# Patient Record
Sex: Female | Born: 1975 | Race: White | Hispanic: No | Marital: Single | Smoking: Former smoker
Health system: Southern US, Community
[De-identification: ages and names within clinical notes are randomized; demographics above are authoritative.]

## PROBLEM LIST (undated history)

## (undated) DIAGNOSIS — N390 Urinary tract infection, site not specified: Secondary | ICD-10-CM

## (undated) DIAGNOSIS — E079 Disorder of thyroid, unspecified: Secondary | ICD-10-CM

## (undated) DIAGNOSIS — F988 Other specified behavioral and emotional disorders with onset usually occurring in childhood and adolescence: Secondary | ICD-10-CM

## (undated) DIAGNOSIS — F319 Bipolar disorder, unspecified: Secondary | ICD-10-CM

## (undated) DIAGNOSIS — F329 Major depressive disorder, single episode, unspecified: Secondary | ICD-10-CM

## (undated) DIAGNOSIS — F32A Depression, unspecified: Secondary | ICD-10-CM

## (undated) DIAGNOSIS — E039 Hypothyroidism, unspecified: Secondary | ICD-10-CM

## (undated) HISTORY — PX: MOUTH SURGERY: SHX715

## (undated) HISTORY — PX: TUBAL LIGATION: SHX77

---

## 1997-03-07 ENCOUNTER — Inpatient Hospital Stay (HOSPITAL_COMMUNITY): Admission: AD | Admit: 1997-03-07 | Discharge: 1997-03-07 | Payer: Self-pay | Admitting: Obstetrics & Gynecology

## 1997-03-12 ENCOUNTER — Ambulatory Visit (HOSPITAL_COMMUNITY): Admission: RE | Admit: 1997-03-12 | Discharge: 1997-03-12 | Payer: Self-pay | Admitting: Obstetrics

## 1997-03-27 ENCOUNTER — Inpatient Hospital Stay (HOSPITAL_COMMUNITY): Admission: AD | Admit: 1997-03-27 | Discharge: 1997-03-27 | Payer: Self-pay | Admitting: Obstetrics

## 1997-04-23 ENCOUNTER — Inpatient Hospital Stay (HOSPITAL_COMMUNITY): Admission: AD | Admit: 1997-04-23 | Discharge: 1997-04-23 | Payer: Self-pay | Admitting: Obstetrics & Gynecology

## 1997-05-02 ENCOUNTER — Inpatient Hospital Stay (HOSPITAL_COMMUNITY): Admission: AD | Admit: 1997-05-02 | Discharge: 1997-05-02 | Payer: Self-pay | Admitting: Obstetrics

## 1997-05-04 ENCOUNTER — Inpatient Hospital Stay (HOSPITAL_COMMUNITY): Admission: AD | Admit: 1997-05-04 | Discharge: 1997-05-04 | Payer: Self-pay | Admitting: *Deleted

## 1997-05-12 ENCOUNTER — Inpatient Hospital Stay (HOSPITAL_COMMUNITY): Admission: AD | Admit: 1997-05-12 | Discharge: 1997-05-18 | Payer: Self-pay | Admitting: *Deleted

## 1997-05-19 ENCOUNTER — Inpatient Hospital Stay (HOSPITAL_COMMUNITY): Admission: AD | Admit: 1997-05-19 | Discharge: 1997-05-26 | Payer: Self-pay | Admitting: Obstetrics

## 1997-06-02 ENCOUNTER — Inpatient Hospital Stay (HOSPITAL_COMMUNITY): Admission: AD | Admit: 1997-06-02 | Discharge: 1997-06-02 | Payer: Self-pay | Admitting: Obstetrics

## 1997-06-03 ENCOUNTER — Encounter: Admission: RE | Admit: 1997-06-03 | Discharge: 1997-09-01 | Payer: Self-pay | Admitting: Obstetrics

## 1997-06-15 ENCOUNTER — Inpatient Hospital Stay (HOSPITAL_COMMUNITY): Admission: AD | Admit: 1997-06-15 | Discharge: 1997-06-16 | Payer: Self-pay | Admitting: *Deleted

## 1997-07-23 ENCOUNTER — Inpatient Hospital Stay (HOSPITAL_COMMUNITY): Admission: AD | Admit: 1997-07-23 | Discharge: 1997-07-25 | Payer: Self-pay | Admitting: Obstetrics & Gynecology

## 1997-08-10 ENCOUNTER — Inpatient Hospital Stay (HOSPITAL_COMMUNITY): Admission: AD | Admit: 1997-08-10 | Discharge: 1997-08-10 | Payer: Self-pay | Admitting: *Deleted

## 1997-08-25 ENCOUNTER — Inpatient Hospital Stay (HOSPITAL_COMMUNITY): Admission: AD | Admit: 1997-08-25 | Discharge: 1997-08-25 | Payer: Self-pay | Admitting: Obstetrics

## 1997-08-31 ENCOUNTER — Observation Stay (HOSPITAL_COMMUNITY): Admission: AD | Admit: 1997-08-31 | Discharge: 1997-09-01 | Payer: Self-pay | Admitting: *Deleted

## 1997-09-02 ENCOUNTER — Encounter: Admission: RE | Admit: 1997-09-02 | Discharge: 1997-12-01 | Payer: Self-pay | Admitting: Obstetrics & Gynecology

## 1997-09-02 ENCOUNTER — Inpatient Hospital Stay (HOSPITAL_COMMUNITY): Admission: AD | Admit: 1997-09-02 | Discharge: 1997-09-02 | Payer: Self-pay | Admitting: Obstetrics & Gynecology

## 1997-09-03 ENCOUNTER — Inpatient Hospital Stay (HOSPITAL_COMMUNITY): Admission: AD | Admit: 1997-09-03 | Discharge: 1997-09-03 | Payer: Self-pay | Admitting: Obstetrics

## 1997-09-05 ENCOUNTER — Inpatient Hospital Stay (HOSPITAL_COMMUNITY): Admission: AD | Admit: 1997-09-05 | Discharge: 1997-09-05 | Payer: Self-pay | Admitting: Obstetrics

## 1997-09-08 ENCOUNTER — Inpatient Hospital Stay (HOSPITAL_COMMUNITY): Admission: AD | Admit: 1997-09-08 | Discharge: 1997-09-11 | Payer: Self-pay | Admitting: Obstetrics

## 1998-01-12 ENCOUNTER — Emergency Department (HOSPITAL_COMMUNITY): Admission: EM | Admit: 1998-01-12 | Discharge: 1998-01-12 | Payer: Self-pay | Admitting: Emergency Medicine

## 1998-01-12 ENCOUNTER — Encounter: Payer: Self-pay | Admitting: Emergency Medicine

## 1998-03-11 ENCOUNTER — Other Ambulatory Visit: Admission: RE | Admit: 1998-03-11 | Discharge: 1998-03-11 | Payer: Self-pay

## 1998-03-11 ENCOUNTER — Other Ambulatory Visit: Admission: RE | Admit: 1998-03-11 | Discharge: 1998-03-11 | Payer: Self-pay | Admitting: Obstetrics

## 1998-04-26 ENCOUNTER — Emergency Department (HOSPITAL_COMMUNITY): Admission: EM | Admit: 1998-04-26 | Discharge: 1998-04-26 | Payer: Self-pay | Admitting: Emergency Medicine

## 1998-07-30 ENCOUNTER — Emergency Department (HOSPITAL_COMMUNITY): Admission: EM | Admit: 1998-07-30 | Discharge: 1998-07-30 | Payer: Self-pay | Admitting: Emergency Medicine

## 2000-02-28 ENCOUNTER — Encounter: Payer: Self-pay | Admitting: Obstetrics

## 2000-02-28 ENCOUNTER — Inpatient Hospital Stay (HOSPITAL_COMMUNITY): Admission: AD | Admit: 2000-02-28 | Discharge: 2000-02-28 | Payer: Self-pay | Admitting: Obstetrics

## 2000-03-28 ENCOUNTER — Inpatient Hospital Stay (HOSPITAL_COMMUNITY): Admission: AD | Admit: 2000-03-28 | Discharge: 2000-03-28 | Payer: Self-pay | Admitting: Obstetrics and Gynecology

## 2000-04-27 ENCOUNTER — Inpatient Hospital Stay (HOSPITAL_COMMUNITY): Admission: AD | Admit: 2000-04-27 | Discharge: 2000-04-27 | Payer: Self-pay | Admitting: Obstetrics & Gynecology

## 2000-05-08 ENCOUNTER — Encounter: Payer: Self-pay | Admitting: Obstetrics & Gynecology

## 2000-05-08 ENCOUNTER — Inpatient Hospital Stay (HOSPITAL_COMMUNITY): Admission: AD | Admit: 2000-05-08 | Discharge: 2000-05-08 | Payer: Self-pay | Admitting: Obstetrics & Gynecology

## 2000-06-28 ENCOUNTER — Inpatient Hospital Stay (HOSPITAL_COMMUNITY): Admission: AD | Admit: 2000-06-28 | Discharge: 2000-06-28 | Payer: Self-pay | Admitting: Obstetrics and Gynecology

## 2000-07-02 ENCOUNTER — Inpatient Hospital Stay (HOSPITAL_COMMUNITY): Admission: AD | Admit: 2000-07-02 | Discharge: 2000-07-02 | Payer: Self-pay | Admitting: Obstetrics & Gynecology

## 2000-07-22 ENCOUNTER — Inpatient Hospital Stay (HOSPITAL_COMMUNITY): Admission: AD | Admit: 2000-07-22 | Discharge: 2000-07-22 | Payer: Self-pay | Admitting: *Deleted

## 2000-07-23 ENCOUNTER — Ambulatory Visit (HOSPITAL_COMMUNITY): Admission: RE | Admit: 2000-07-23 | Discharge: 2000-07-23 | Payer: Self-pay | Admitting: Obstetrics and Gynecology

## 2000-07-23 ENCOUNTER — Encounter: Payer: Self-pay | Admitting: Obstetrics and Gynecology

## 2000-08-22 ENCOUNTER — Inpatient Hospital Stay: Admission: AD | Admit: 2000-08-22 | Discharge: 2000-08-22 | Payer: Self-pay | Admitting: Obstetrics and Gynecology

## 2000-09-09 ENCOUNTER — Inpatient Hospital Stay (HOSPITAL_COMMUNITY): Admission: AD | Admit: 2000-09-09 | Discharge: 2000-09-09 | Payer: Self-pay | Admitting: Obstetrics and Gynecology

## 2000-09-28 ENCOUNTER — Inpatient Hospital Stay (HOSPITAL_COMMUNITY): Admission: AD | Admit: 2000-09-28 | Discharge: 2000-09-28 | Payer: Self-pay | Admitting: Obstetrics and Gynecology

## 2000-09-29 ENCOUNTER — Inpatient Hospital Stay (HOSPITAL_COMMUNITY): Admission: AD | Admit: 2000-09-29 | Discharge: 2000-09-29 | Payer: Self-pay | Admitting: Obstetrics and Gynecology

## 2000-10-02 ENCOUNTER — Ambulatory Visit (HOSPITAL_COMMUNITY): Admission: RE | Admit: 2000-10-02 | Discharge: 2000-10-02 | Payer: Self-pay | Admitting: Obstetrics and Gynecology

## 2000-10-02 ENCOUNTER — Encounter: Payer: Self-pay | Admitting: Obstetrics and Gynecology

## 2000-10-12 ENCOUNTER — Inpatient Hospital Stay (HOSPITAL_COMMUNITY): Admission: AD | Admit: 2000-10-12 | Discharge: 2000-10-16 | Payer: Self-pay | Admitting: Obstetrics and Gynecology

## 2000-11-18 ENCOUNTER — Other Ambulatory Visit: Admission: RE | Admit: 2000-11-18 | Discharge: 2000-11-18 | Payer: Self-pay | Admitting: Obstetrics & Gynecology

## 2001-02-26 ENCOUNTER — Emergency Department (HOSPITAL_COMMUNITY): Admission: EM | Admit: 2001-02-26 | Discharge: 2001-02-26 | Payer: Self-pay | Admitting: Emergency Medicine

## 2001-02-26 ENCOUNTER — Encounter: Payer: Self-pay | Admitting: Emergency Medicine

## 2001-10-30 ENCOUNTER — Encounter: Payer: Self-pay | Admitting: Emergency Medicine

## 2001-10-30 ENCOUNTER — Inpatient Hospital Stay (HOSPITAL_COMMUNITY): Admission: EM | Admit: 2001-10-30 | Discharge: 2001-11-05 | Payer: Self-pay | Admitting: Emergency Medicine

## 2001-10-31 ENCOUNTER — Encounter: Payer: Self-pay | Admitting: Internal Medicine

## 2001-11-24 ENCOUNTER — Ambulatory Visit (HOSPITAL_COMMUNITY): Admission: RE | Admit: 2001-11-24 | Discharge: 2001-11-24 | Payer: Self-pay | Admitting: Family Medicine

## 2001-12-30 ENCOUNTER — Emergency Department (HOSPITAL_COMMUNITY): Admission: EM | Admit: 2001-12-30 | Discharge: 2001-12-30 | Payer: Self-pay

## 2002-01-07 ENCOUNTER — Emergency Department (HOSPITAL_COMMUNITY): Admission: EM | Admit: 2002-01-07 | Discharge: 2002-01-07 | Payer: Self-pay | Admitting: Emergency Medicine

## 2002-03-30 ENCOUNTER — Encounter: Payer: Self-pay | Admitting: Emergency Medicine

## 2002-03-30 ENCOUNTER — Emergency Department (HOSPITAL_COMMUNITY): Admission: EM | Admit: 2002-03-30 | Discharge: 2002-03-30 | Payer: Self-pay | Admitting: Emergency Medicine

## 2002-05-11 ENCOUNTER — Emergency Department (HOSPITAL_COMMUNITY): Admission: EM | Admit: 2002-05-11 | Discharge: 2002-05-11 | Payer: Self-pay | Admitting: Emergency Medicine

## 2002-07-19 ENCOUNTER — Emergency Department (HOSPITAL_COMMUNITY): Admission: EM | Admit: 2002-07-19 | Discharge: 2002-07-19 | Payer: Self-pay | Admitting: Emergency Medicine

## 2002-07-19 ENCOUNTER — Encounter: Payer: Self-pay | Admitting: Emergency Medicine

## 2002-09-02 ENCOUNTER — Emergency Department (HOSPITAL_COMMUNITY): Admission: EM | Admit: 2002-09-02 | Discharge: 2002-09-02 | Payer: Self-pay | Admitting: Emergency Medicine

## 2002-09-02 ENCOUNTER — Encounter: Payer: Self-pay | Admitting: Emergency Medicine

## 2002-09-03 ENCOUNTER — Emergency Department (HOSPITAL_COMMUNITY): Admission: EM | Admit: 2002-09-03 | Discharge: 2002-09-04 | Payer: Self-pay

## 2002-09-05 ENCOUNTER — Emergency Department (HOSPITAL_COMMUNITY): Admission: EM | Admit: 2002-09-05 | Discharge: 2002-09-05 | Payer: Self-pay | Admitting: Emergency Medicine

## 2002-09-07 ENCOUNTER — Emergency Department (HOSPITAL_COMMUNITY): Admission: EM | Admit: 2002-09-07 | Discharge: 2002-09-07 | Payer: Self-pay | Admitting: Emergency Medicine

## 2002-12-17 ENCOUNTER — Inpatient Hospital Stay (HOSPITAL_COMMUNITY): Admission: AD | Admit: 2002-12-17 | Discharge: 2002-12-19 | Payer: Self-pay | Admitting: Obstetrics & Gynecology

## 2002-12-22 ENCOUNTER — Ambulatory Visit (HOSPITAL_COMMUNITY): Admission: RE | Admit: 2002-12-22 | Discharge: 2002-12-22 | Payer: Self-pay | Admitting: Obstetrics and Gynecology

## 2003-02-04 ENCOUNTER — Inpatient Hospital Stay (HOSPITAL_COMMUNITY): Admission: AD | Admit: 2003-02-04 | Discharge: 2003-02-04 | Payer: Self-pay | Admitting: Obstetrics and Gynecology

## 2003-02-16 ENCOUNTER — Inpatient Hospital Stay (HOSPITAL_COMMUNITY): Admission: AD | Admit: 2003-02-16 | Discharge: 2003-02-16 | Payer: Self-pay | Admitting: Obstetrics and Gynecology

## 2003-03-19 ENCOUNTER — Observation Stay (HOSPITAL_COMMUNITY): Admission: AD | Admit: 2003-03-19 | Discharge: 2003-03-20 | Payer: Self-pay | Admitting: Obstetrics and Gynecology

## 2003-03-29 ENCOUNTER — Ambulatory Visit (HOSPITAL_COMMUNITY): Admission: RE | Admit: 2003-03-29 | Discharge: 2003-03-29 | Payer: Self-pay | Admitting: Obstetrics and Gynecology

## 2003-04-16 ENCOUNTER — Inpatient Hospital Stay (HOSPITAL_COMMUNITY): Admission: AD | Admit: 2003-04-16 | Discharge: 2003-04-16 | Payer: Self-pay | Admitting: Obstetrics and Gynecology

## 2003-05-03 ENCOUNTER — Inpatient Hospital Stay (HOSPITAL_COMMUNITY): Admission: AD | Admit: 2003-05-03 | Discharge: 2003-05-03 | Payer: Self-pay | Admitting: Obstetrics and Gynecology

## 2003-05-23 ENCOUNTER — Inpatient Hospital Stay (HOSPITAL_COMMUNITY): Admission: AD | Admit: 2003-05-23 | Discharge: 2003-05-23 | Payer: Self-pay | Admitting: Obstetrics and Gynecology

## 2003-06-03 ENCOUNTER — Inpatient Hospital Stay (HOSPITAL_COMMUNITY): Admission: AD | Admit: 2003-06-03 | Discharge: 2003-06-03 | Payer: Self-pay | Admitting: Obstetrics and Gynecology

## 2003-06-17 ENCOUNTER — Inpatient Hospital Stay (HOSPITAL_COMMUNITY): Admission: AD | Admit: 2003-06-17 | Discharge: 2003-06-17 | Payer: Self-pay | Admitting: Obstetrics and Gynecology

## 2003-06-25 ENCOUNTER — Inpatient Hospital Stay (HOSPITAL_COMMUNITY): Admission: AD | Admit: 2003-06-25 | Discharge: 2003-06-25 | Payer: Self-pay | Admitting: Obstetrics and Gynecology

## 2003-06-26 ENCOUNTER — Emergency Department (HOSPITAL_COMMUNITY): Admission: EM | Admit: 2003-06-26 | Discharge: 2003-06-26 | Payer: Self-pay | Admitting: Emergency Medicine

## 2003-06-26 ENCOUNTER — Inpatient Hospital Stay (HOSPITAL_COMMUNITY): Admission: AD | Admit: 2003-06-26 | Discharge: 2003-06-26 | Payer: Self-pay | Admitting: Obstetrics and Gynecology

## 2003-07-05 ENCOUNTER — Inpatient Hospital Stay (HOSPITAL_COMMUNITY): Admission: AD | Admit: 2003-07-05 | Discharge: 2003-07-06 | Payer: Self-pay | Admitting: Obstetrics

## 2003-07-15 ENCOUNTER — Inpatient Hospital Stay (HOSPITAL_COMMUNITY): Admission: AD | Admit: 2003-07-15 | Discharge: 2003-07-15 | Payer: Self-pay | Admitting: Obstetrics

## 2003-07-22 ENCOUNTER — Inpatient Hospital Stay (HOSPITAL_COMMUNITY): Admission: AD | Admit: 2003-07-22 | Discharge: 2003-07-22 | Payer: Self-pay | Admitting: Obstetrics

## 2003-07-24 ENCOUNTER — Inpatient Hospital Stay (HOSPITAL_COMMUNITY): Admission: AD | Admit: 2003-07-24 | Discharge: 2003-07-24 | Payer: Self-pay | Admitting: Obstetrics

## 2003-07-25 ENCOUNTER — Inpatient Hospital Stay (HOSPITAL_COMMUNITY): Admission: AD | Admit: 2003-07-25 | Discharge: 2003-07-25 | Payer: Self-pay | Admitting: Obstetrics

## 2003-08-05 ENCOUNTER — Inpatient Hospital Stay (HOSPITAL_COMMUNITY): Admission: AD | Admit: 2003-08-05 | Discharge: 2003-08-05 | Payer: Self-pay | Admitting: Obstetrics

## 2003-08-10 ENCOUNTER — Ambulatory Visit (HOSPITAL_COMMUNITY): Admission: RE | Admit: 2003-08-10 | Discharge: 2003-08-10 | Payer: Self-pay | Admitting: Obstetrics

## 2003-08-12 ENCOUNTER — Inpatient Hospital Stay (HOSPITAL_COMMUNITY): Admission: AD | Admit: 2003-08-12 | Discharge: 2003-08-14 | Payer: Self-pay | Admitting: Obstetrics

## 2003-10-20 ENCOUNTER — Inpatient Hospital Stay (HOSPITAL_COMMUNITY): Admission: AD | Admit: 2003-10-20 | Discharge: 2003-10-20 | Payer: Self-pay | Admitting: Obstetrics

## 2003-11-10 ENCOUNTER — Emergency Department (HOSPITAL_COMMUNITY): Admission: EM | Admit: 2003-11-10 | Discharge: 2003-11-10 | Payer: Self-pay | Admitting: Emergency Medicine

## 2004-02-14 ENCOUNTER — Emergency Department (HOSPITAL_COMMUNITY): Admission: EM | Admit: 2004-02-14 | Discharge: 2004-02-14 | Payer: Self-pay | Admitting: Emergency Medicine

## 2004-06-15 ENCOUNTER — Emergency Department (HOSPITAL_COMMUNITY): Admission: EM | Admit: 2004-06-15 | Discharge: 2004-06-15 | Payer: Self-pay | Admitting: Emergency Medicine

## 2006-04-10 ENCOUNTER — Inpatient Hospital Stay (HOSPITAL_COMMUNITY): Admission: AD | Admit: 2006-04-10 | Discharge: 2006-04-10 | Payer: Self-pay | Admitting: Gynecology

## 2006-04-10 ENCOUNTER — Ambulatory Visit: Payer: Self-pay | Admitting: *Deleted

## 2010-02-25 ENCOUNTER — Encounter: Payer: Self-pay | Admitting: Obstetrics and Gynecology

## 2010-06-23 NOTE — Consult Note (Signed)
[name], [name] NO.:  [id number]   MEDICAL RECORD NO.:  [id number]                   PATIENT TYPE:  INP   LOCATION:  0369                                 FACILITY:  [facility]   PHYSICIAN:  Genene Churn. Love, MD                   DATE OF BIRTH:  Jun 11, 1975   DATE OF CONSULTATION:  10/31/2001  DATE OF DISCHARGE:                                   CONSULTATION   The patient's address is [address] [place], [place],  [place]  [address].   INTRODUCTION:  This 35 year old right-handed white married female was seen  in consultation through Dr. Tresa Endo for evaluation of presyncope.   HISTORY OF PRESENT ILLNESS:  The patient has had a history of depression  since January 2003.  Her husband has been in prison, and she had a child in  the fall of 2002.  In January she was placed on Zoloft for symptoms of  depression and several days ago was placed on Wellbutrin for ADD by Dr.  Valentina Lucks.  Last week while at school she had some headache and chest pain  with lightheaded sensation and was seen by EMTs there and found to have a  blood pressure of 86/52.  She has been having some symptoms of nausea and  diarrhea and yesterday was driving her car.  She felt nauseated, had  diarrhea, and came to the emergency room.  There she was noted to have a  drop in blood pressure into the 70/40 range.  She was having some episodes  of not knowing exactly what she was doing and possibly confusion which she  related to hypotension as well as some headache related to the hypotension.  She has no personal history of seizures but does have a father who has had a  history of seizures.  Her current medications are Wellbutrin 150 mg q.d.  started three days prior to her admission and Zoloft 100 mg per day started  in January 2003.  She does not abuse alcohol.  She does smoke a pack per day  of cigarettes.  She has had no other history of serious medical conditions.  She has no other family history of serious medical conditions.  She has  three children, ages 58, 56, and 1.   PHYSICAL EXAMINATION:  GENERAL:  Well-developed, obese white female in no  acute distress.  VITAL SIGNS:  Blood pressure in the right and left arm of 90/60, standing it  was 100/60, heart rate was 75.  Telemetry showed normal sinus rhythm.  There  were no bruits.  NEUROLOGIC:  She was alert and oriented x 3 and followed three-step  commands.  Cranial nerve examination revealed her visual fields to be full.  Pupils reactive from 5 to 3 bilaterally.  Corneals were present.  There was  no seventh nerve palsy.  Hearing was intact.  Air conduction was greater  than bone conduction.  The tongue was midline.  The uvula was midline.  Gag  was present.  Sternocleidomastoid and trapezius testing were normal.  Motor  examination revealed bilateral body strength proximally and distally in the  upper and lower extremities.  Coordination testing was normal.  Finger-to-  nose, heel-to-shin, rapid alternating movements were normal.  Sensory  examination was intact to pinprick, touch, joint position.  Deep tendon  reflexes were 2+.  Plantar responses were downgoing.   LABORATORY DATA:  Initial white blood cell count was 9100 with repeat of  5100, hemoglobins were 12.5 and 12.2, with hematocrit 35.4.  Sodium 140,  today 3.8.  Potassium chloride 113, CO2 of 24, BUN 7, creatinine 0.8,  glucose 101.  Urine pregnancy test was negative.  Serum cortisol was 11.5.   a CT scan of the brain without contrast enhancement, which I have reviewed,  was unremarkable.   IMPRESSION:  1. Presyncope, code 780.2, possibly related to vasovagal reactions and     hypotension.  2. History of hypotension, code 458.0.  3. Gastrointestinal upset, etiology unknown.  4. Depression, code 311.   PLAN:  The plan at this time is to do a 24-hour urine for porphobilinogens  and EEG.                                                Genene Churn. Sandria Manly, MD    JML/MEDQ  D:  10/31/2001  T:  10/31/2001  Job:  [id number]

## 2010-06-23 NOTE — Op Note (Signed)
[name], [name] NO.:  [id number]   MEDICAL RECORD NO.:  [id number]                   PATIENT TYPE:  OIB   LOCATION:  2856                                 FACILITY:  [facility]   PHYSICIAN:  Armanda Magic, M.D.                  DATE OF BIRTH:  09-15-1975   DATE OF PROCEDURE:  11/24/2001  DATE OF DISCHARGE:  11/24/2001                                 OPERATIVE REPORT   CLINICAL NOTE:  This is a 35 year old white female with a recent  hospitalization for presyncope and nausea and dizzy spells.  Also reportedly  with chest pain, although it is not stated in any of her H&P's or consults.  She states that she has been having problems with chest pain described as  stabbing pain that occurs with skipped heartbeats, usually two to three  times a day for 15 minutes, and this has been going on for some time.  She  now presents for tilt table testing.   DESCRIPTION OF PROCEDURE:  The patient was brought to the cardiac  catheterization laboratory in the fasting, nonsedated state.  Informed  consent was obtained.  The patient was connected to continuous heart rate  and pulse oximetry monitoring, intermittent blood pressure monitoring.  The  patient's blood pressure was measured supine for five minutes and ranged  anywhere from 106/57 to 123/71 with heart rates in the 50s-60s.  Immediately  upon upright tilt to 70 degrees, the patient complained of dizziness.  Blood  pressure was stable at 114/66 with a heart rate of 75.  Throughout the  upright tilt, the patient complained of nausea, being sleepy and  lightheaded, with really no change in her blood pressure.  She also  developed a headache.  Twenty minutes into the upright tilt she started  complaining of left-sided chest pain which is typical chest pain she gets.  EKG showed no ischemia.  It was decided because of the intermittent chest  pain that we would not use Isuprel and continued a 30-minute upright tilt.  Her blood pressure pretty much remained stable throughout the tilt, except  at one point it went to 77/49 mmHg with no change in her symptoms, heart  rate was 96 beats per minute.  This was at 41 minutes into the upright tilt.  At the end of the tilt the patient was placed supine.  She was chest pain-  free at that time.   ASSESSMENT:  Negative tilt table test for syncope.  She did have one episode  of transient orthostatic hypotension, but this did not create any new  symptoms of dizziness or nausea.   PLAN:  We will discharge her to home.  She will follow up with Dr. Fraser Din  as an outpatient.  Armanda Magic, M.D.    TT/MEDQ  D:  11/25/2001  T:  11/25/2001  Job:  [id number]

## 2010-06-23 NOTE — Op Note (Signed)
   [name], [name] NO.:  [id number]   MEDICAL RECORD NO.:  [id number]                   PATIENT TYPE:  INP   LOCATION:  0369                                 FACILITY:  [facility]   PHYSICIAN:  Genene Churn. Love, MD                   DATE OF BIRTH:  1975-02-18   DATE OF PROCEDURE:  11/01/2001  DATE OF DISCHARGE:                                 OPERATIVE REPORT   PROCEDURE:  EEG.   EEG NO.:  [id number]   CLINICAL INFORMATION:  This 35 year old female has had a history of  presyncope and hypotension, with altered mental status.  A CT scan has been  unremarkable and blood studies today have been negative.   TECHNICAL DESCRIPTION:  This EEG was recorded during the awake state.  The  background activity shows 12 Hz rhythms of higher amplitude seen in the  posterior head regions bilaterally.  No focal asymmetries, phase II sleep or  epileptic form activities are present.  Hyperventilation testing and photic  stimulation were not performed.   IMPRESSION:  Normal EEG during the awake state.                                               Genene Churn. Sandria Manly, MD    JML/MEDQ  D:  11/01/2001  T:  11/01/2001  Job:  [id number]

## 2010-06-23 NOTE — Consult Note (Signed)
NAME:  [name], [name]                          ACCOUNT NO.:  [id number]   MEDICAL RECORD NO.:  [id number]                   PATIENT TYPE:  MAT   LOCATION:  MATC                                 FACILITY:  [facility]   PHYSICIAN:  Charles A. Sydnee Cabal, MD            DATE OF BIRTH:  1975-02-23   DATE OF CONSULTATION:  06/25/2003  DATE OF DISCHARGE:                                   CONSULTATION   EMERGENCY ROOM CONSULTATION   REASON FOR CONSULTATION:  A 35 year old para 4-0-2-4 at 31-and-a-half weeks  gestation by 5-week ultrasound giving [facility] August 22, 2003 who was noted in the  office today to have tight fingertip dilation.  She complained of  contractions.  She has refused to take terbutaline as directed 5 mg q.4h.  She did this for several days and then stopped it 2 days ago, stating it did  not help the pain.  She has been noncompliant in multiple recommendations  for terbutaline, bedrest, help with the children, notify if any  contractions, and comes in now with this complaint.  She states she has pain  in the abdomen and wants pain medication.  She had some take-home Vicodin  from a previous ER evaluation.  She is not sleeping well.  She notes active  fetal movement.  Denies ruptured membranes or bleeding.   PAST MEDICAL HISTORY:  None.   SURGICAL HISTORY:  SVD x4, elective abortion x1, spontaneous abortion x1.   MEDICATIONS:  Vicodin p.r.n., NataChew.   ALLERGIES:  No known drug allergies.   SOCIAL HISTORY:  Currently not with father of the baby.  No alcohol or drug  use.   FAMILY HISTORY:  Noncontributory.   PHYSICAL EXAMINATION:  VITAL SIGNS:  See chart for update.  Otherwise,  admission 107/60 blood pressure, pulse 111, respirations 20, temperature  98.5.  HEENT:  Grossly normal.  NECK:  Supple.  CORONARY:  Regular rate and rhythm.  LUNGS:  Clear.  ABDOMEN:  Soft, fundal height consistent with 31-32 weeks.  PELVIC:  Exam per my clinic today:  Tight fingertip, posterior,  and high,  ballotable.  Mildly softened but not very much and only very little effaced.  EXTREMITIES:  Mild swelling of lower extremities.   Fetal heart rate is 140s, reactive, without accelerations.  Occasional  contractions every 5-7 minutes are noted.   ASSESSMENT:  1. Intrauterine pregnancy at 31-and-a-half weeks.  2. Preterm contractions.  3. Laboratory data:  CMP and CBC were normal.  Very mild anemia with     hematocrit 33.5.  Urinalysis was normal.  4. Symptoms consistent with gastroesophageal reflux disease, currently mild.   PLAN:  1. Subcu terbutaline 0.25 x1; terbutaline 5 mg q.4h. - prescription given.  2. Vicodin occasionally at night, at most two times a day - prescription is     given.  3. Ambien 10 mg h.s. p.r.n., use sparingly.  4. Bedrest.  5. Help with  family.  6. Preterm labor precautions given.  7. Follow up in the office next Wednesday; otherwise, return for     reevaluation.   She was in agreement.  We discussed steroids and secondary to threatening  precautions and some risk at this point, steroids were discussed.  She gives  informed consent in this regard.  Betamethasone 12.5 now and repeat in 24  hours.  Risks and benefits were all discussed.                                               Charles A. Sydnee Cabal, MD    CAD/MEDQ  D:  06/25/2003  T:  06/26/2003  Job:  [id number]

## 2010-06-23 NOTE — Discharge Summary (Signed)
NAME:  [name], [name]                          ACCOUNT NO.:  [id number]   MEDICAL RECORD NO.:  [id number]                   PATIENT TYPE:  OBV   LOCATION:  9320                                 FACILITY:  [facility]   PHYSICIAN:  Rudy Jew. Ashley Royalty, M.D.             DATE OF BIRTH:  24-Jun-1975   DATE OF ADMISSION:  03/19/2003  DATE OF DISCHARGE:  03/20/2003                                 DISCHARGE SUMMARY   DISCHARGE DIAGNOSES:  1. Intrauterine pregnancy at approximately [redacted] weeks gestation.  2. Preterm irritability, resolved.  3. Bacterial vaginosis.   PROCEDURES:  None.   DISCHARGE MEDICATIONS:  Flagyl 500 mg p.o. b.i.d. x7 days.   HISTORY OF PRESENT ILLNESS:  This is a 35 year old gravida 7, para 4, at  approximately  [redacted] weeks gestation. No prenatal records were available. She  presented to [facility] complaining of contractions and back  discomfort. She was subsequently forwarded to [facility] for further  evaluation and therapy.   Upon arrival she denied any leakage of fluids or bleeding. Examination of  the toco monitor  revealed uterine irritability. Fetal heart rate was  excellent. Pelvic examination  was essentially negative. Numerous cultures  were obtained including  GC and Chlamydia and group B Strep. Wet prep  revealed bacterial vaginosis. Urinalysis  was negative.   HOSPITAL COURSE:  The patient was objectively speaking stable for discharge  from the MAU area. However, she was quite adamant about obtaining an  obstetrical using because in another pregnancy she turned out to have an  adnexal mass which caused her to have quite a bit of pain prior to its  diagnosis. She also was writhing in the bed somewhat around the time of  proposed discharge  and the decision was made to hold her for 23 hour  observation.   Over the course of the same calendar day, she was noted to be quite  comfortable in the bed requiring no medications. An ultrasound was performed  which was consistent with approximately [redacted] weeks gestation. There was no  evidence of any abnormality or adnexal mass. On the afternoon of March 20, 2003, the patient was felt to be stable for discharge and was discharged  to home in satisfactory condition.   ACCESSORY CLINICAL FINDINGS:  Urinalysis was negative. The remainder of the  cultures are pending  at the time of this dictation.                                               James A. Ashley Royalty, M.D.    JAM/MEDQ  D:  03/20/2003  T:  03/20/2003  Job:  [id number]   cc:   Leonette Most A. Sydnee Cabal, MD  Fax: [phone]

## 2010-06-23 NOTE — Discharge Summary (Signed)
NAMELILIENNE, WEINS NO.:  [id number]   MEDICAL RECORD NO.:  [id number]                   PATIENT TYPE:  INP   LOCATION:  1914                                 FACILITY:  [facility]   PHYSICIAN:  Lazaro Arms, M.D.        DATE OF BIRTH:  [birth year]   DATE OF ADMISSION:  10/30/2001  DATE OF DISCHARGE:                                 DISCHARGE SUMMARY   HISTORY OF PRESENT ILLNESS:  The patient is a 35 year old female who  presented to the emergency room on September 25 with a presyncopal episode.  Her review of systems was significant for a history of depression in January  2003 for which she had been on Zoloft and a more recent history of having  recently started Wellbutrin several days prior to presentation.  In the week  prior to presentation she had had some lightheaded sensations associated  with a blood pressure of 86/52 and had had some gastroenteritis symptoms on  the day prior to admission.  In the emergency room she had a blood pressure  of 70/40.  She does associate these headaches and lightheaded feelings  specifically when she changes position.  Almost all the episodes are on  going from sitting to standing or lying to standing.  She was admitted to  the hospital and neurologic consultation by Dr. Sandria Manly was obtained for work-  up of presyncope.   HOSPITAL COURSE:  Neurologic investigation by Dr. Sandria Manly was essentially  within normal limits.  She had an EEG which was normal.  She had a CT of the  head which was normal.  Her neurologic examination remained within normal  limits.  In addition, her blood sugars were investigated and they were also  within normal limits.  The patient was able to ambulate independently, and  in fact, was making regular visits outside to smoke cigarettes and had no  problem with that.  She did, however, continue to have the dizziness and  lightheadedness whenever standing up, especially suddenly, although  this  improved with IV hydration during the hospitalization.  She also had an  echocardiogram done to look at cardiac causes of presyncope and it was a  technically difficult study, although essentially within normal limits.  Her  ejection fraction was at the lower limits of normal and there were no  valvular abnormalities found.  Further investigation of her symptoms was  cortisol level which was within normal limits as well as urine for perforins  and metanephrine's.  The urine for perforins was negative.  The urine for  metanephrine's was is still pending.  On the day of discharge the patient  was asymptomatic.  She was afebrile.  Pulse was 72.  Blood pressure was  90/49.  Blood sugar was 88 fasting.  She was able to stand up without any  dizziness.  She had a normal cardiac and pulmonary examination.  Her balance  was okay  and her gait was within normal limits.  The patient was discharged  in stable condition.   DISCHARGE DIAGNOSES:  1. Presyncopal episode likely related to volume depletion and possibly     Wellbutrin medication side effect.  2. Depression.   DISCHARGE PLAN:  1. She is to follow-up with Dr. Valentina Lucks next week.  2. She is to discontinue her Wellbutrin.  3. She is to drink at least 64 ounces of fluid a day and make sure she has     adequate salt in her diet.    DISCHARGE MEDICATIONS:  1. Zoloft 100 mg p.o. q.d.  2. Protonix 40 mg p.o. q.d.   The patient was also advised to stop smoking.                                               Lazaro Arms, M.D.    AMC/MEDQ  D:  11/05/2001  T:  11/05/2001  Job:  [id number]   cc:   Gretta Arab. Valentina Lucks, M.D.

## 2010-06-23 NOTE — Consult Note (Signed)
NAME:  [name]                        ACCOUNT NO.:  [id number]   MEDICAL RECORD NO.:  [id number]                   PATIENT TYPE:  INP   LOCATION:  9373                                 FACILITY:  [facility]   PHYSICIAN:  Sandria Bales. Ezzard Standing, M.D.               DATE OF BIRTH:  24-Dec-1975   DATE OF CONSULTATION:  DATE OF DISCHARGE:                                   CONSULTATION   REASON FOR CONSULTATION:  Abdominal pain, rule out appendicitis.   HISTORY OF PRESENT ILLNESS:  This is a 35 year old white female who sees Dr.  Maurice Small as her primary medical physician. She presented this evening  to the [facility] emergency department with 24-hour history of  cramping abdominal pain. She found out she was pregnant earlier this week,  actually seeing Dr. Maurice Small this week, was set up to see [facility]  OB/GYN, but she is not sure of the physician she was supposed to see and  anyway presented tonight into the [facility] emergency department. She  describes her pain as beginning yesterday. It was crampy in nature with some  mild nausea. It then got worse today with knife-like pain, and she presented  with what sounds like about 1 or 2:00 today. She has had no vomiting. She  had a normal bowel movement right before she came here. This afternoon, she  was able to eat some eggs and [place] fries, and she is actually very hungry  at the time of my interview with her.   She denies a history of peptic ulcer disease, liver disease, pancreatic  disease, change in bowel habits. She has had no prior history of laparoscopy  or abdominal operation.   From a GI standpoint, she has had four live pregnancies with her children  being 9, 8, 5, and 2. She has had one abortion and one miscarriage, and  again, she thinks she is about eight weeks into this currently pregnancy.   ALLERGIES:  She has no allergies.   REVIEW OF SYSTEMS:  PULMONARY:  She smokes cigarettes and knows this is bad  for her  health and the health of her children. CARDIAC:  She has no history  of heart disease or chest pain. GASTROINTESTINAL:  See history of present  illness. UROLOGIC:  No history of kidney stones or kidney infections.  GYNECOLOGICAL:  Besides her pregnancy, she did have an abnormal Pap, I think  1998, with a colposcopy around that time.   SOCIAL HISTORY:  She works at a [employer].   PHYSICAL EXAMINATION:  VITAL SIGNS:  Her temperature is 99.2, blood pressure  111/57, pulse 87.  GENERAL:  She is a well-nourished, white female, alert and cooperative. On  physical exam, she was lying on her abdomen asleep when I entered the room.  She rolls over really without any pain.  HEENT:  Her HEENT is unremarkable.  NECK:  Her neck  is supple. I feel no masses, no thyromegaly.  LUNGS:  Clear to auscultation.  HEART:  Her heart has a regular rate and rhythm. I hear no murmur or rub.  ABDOMEN:  She has mild tenderness in her right abdomen and suprapubic area,  and actually, the patient is almost around to her flank. She has no  guarding, no rebound. Her bowel sounds are decreased but relatively normal.  She has no palpable mass. No evidence of hernia.  EXTREMITIES:  She has good strength in the upper and lower extremities.  NEUROLOGICAL:  Grossly intact.   LABORATORY DATA:  Labs that I have show a white blood cell count of 7,400  with a differential of 69% neutrophils, 26% lymphocytes, 5% monocytes. She  had a HCG that was quantitative of 1,609 which actually correlates with  probably about a 3 to 4 week pregnancy. She has a negative urinalysis.   IMPRESSION:  Impression is that of probably early pregnancy. I doubt her  abdominal pain is appendicitis. I think it is reasonable to go ahead and  start her on some liquids at this time. She is going to be admitted by Dr.  Ashley Royalty for further observation and repeating of her HCG. I would probably  repeat her CBC in the morning to make  sure that is no different, and I would  be happy to follow her.                                               Sandria Bales. Ezzard Standing, M.D.    DHN/MEDQ  D:  12/17/2002  T:  12/18/2002  Job:  [id number]   cc:   Gretta Arab. Valentina Lucks, M.D.  [address] [place]  [place] [address]  Fax: [phone]   Rudy Jew. Ashley Royalty, M.D.  [address]  [place], [place] [address]  Fax: [phone]

## 2011-06-27 ENCOUNTER — Encounter (HOSPITAL_COMMUNITY): Payer: Self-pay | Admitting: *Deleted

## 2011-06-27 ENCOUNTER — Emergency Department (HOSPITAL_COMMUNITY): Payer: Self-pay

## 2011-06-27 ENCOUNTER — Emergency Department (HOSPITAL_COMMUNITY)
Admission: EM | Admit: 2011-06-27 | Discharge: 2011-06-27 | Disposition: A | Discharge status: Home / Self Care | Payer: Self-pay | Attending: Emergency Medicine | Admitting: Emergency Medicine

## 2011-06-27 DIAGNOSIS — R5383 Other fatigue: Secondary | ICD-10-CM

## 2011-06-27 DIAGNOSIS — N390 Urinary tract infection, site not specified: Secondary | ICD-10-CM | POA: Insufficient documentation

## 2011-06-27 DIAGNOSIS — G43909 Migraine, unspecified, not intractable, without status migrainosus: Secondary | ICD-10-CM | POA: Insufficient documentation

## 2011-06-27 HISTORY — DX: Disorder of thyroid, unspecified: E07.9

## 2011-06-27 HISTORY — DX: Other specified behavioral and emotional disorders with onset usually occurring in childhood and adolescence: F98.8

## 2011-06-27 LAB — URINALYSIS, ROUTINE W REFLEX MICROSCOPIC
Bilirubin Urine: NEGATIVE
Glucose, UA: NEGATIVE mg/dL
Ketones, ur: NEGATIVE mg/dL
Leukocytes, UA: NEGATIVE
Nitrite: POSITIVE — AB
Protein, ur: NEGATIVE mg/dL
Specific Gravity, Urine: 1.02 (ref 1.005–1.030)
Urobilinogen, UA: 0.2 mg/dL (ref 0.0–1.0)
pH: 6 (ref 5.0–8.0)

## 2011-06-27 LAB — DIFFERENTIAL
Basophils Absolute: 0 10*3/uL (ref 0.0–0.1)
Basophils Relative: 0 % (ref 0–1)
Eosinophils Absolute: 0.2 10*3/uL (ref 0.0–0.7)
Neutro Abs: 4.3 10*3/uL (ref 1.7–7.7)
Neutrophils Relative %: 57 % (ref 43–77)

## 2011-06-27 LAB — POCT I-STAT, CHEM 8
Glucose, Bld: 96 mg/dL (ref 70–99)
Potassium: 3.7 mEq/L (ref 3.5–5.1)
Sodium: 143 mEq/L (ref 135–145)
TCO2: 21 mmol/L (ref 0–100)

## 2011-06-27 LAB — CBC
MCH: 30.9 pg (ref 26.0–34.0)
MCHC: 34.3 g/dL (ref 30.0–36.0)
RDW: 13.3 % (ref 11.5–15.5)

## 2011-06-27 LAB — URINE MICROSCOPIC-ADD ON

## 2011-06-27 MED ORDER — NITROFURANTOIN MACROCRYSTAL 100 MG PO CAPS
100.0000 mg | ORAL_CAPSULE | Freq: Once | ORAL | Status: AC
  Administered 2011-06-27: 100 mg via ORAL
  Filled 2011-06-27: qty 1

## 2011-06-27 MED ORDER — SODIUM CHLORIDE 0.9 % IV BOLUS (SEPSIS)
1000.0000 mL | Freq: Once | INTRAVENOUS | Status: AC
  Administered 2011-06-27: 1000 mL via INTRAVENOUS

## 2011-06-27 MED ORDER — KETOROLAC TROMETHAMINE 30 MG/ML IJ SOLN
30.0000 mg | Freq: Once | INTRAMUSCULAR | Status: AC
  Administered 2011-06-27: 30 mg via INTRAVENOUS
  Filled 2011-06-27: qty 1

## 2011-06-27 MED ORDER — METOCLOPRAMIDE HCL 5 MG/ML IJ SOLN
10.0000 mg | Freq: Once | INTRAMUSCULAR | Status: AC
  Administered 2011-06-27: 10 mg via INTRAVENOUS
  Filled 2011-06-27: qty 2

## 2011-06-27 MED ORDER — NITROFURANTOIN MACROCRYSTAL 100 MG PO CAPS: 100.0000 mg | ORAL_CAPSULE | Freq: Once | ORAL | Status: AC

## 2011-06-27 MED ORDER — DEXTROSE 5 % IV SOLN: 1.0000 g | Freq: Once | INTRAVENOUS | Status: DC

## 2011-06-27 NOTE — Discharge Instructions (Signed)
Unfortunately had to leave for her for a family emergency before your evaluation.  Could be completed at this point, you do have a urinary tract, infection, and prescribed antibiotics for such You have  been referred to the [facility] to become established for regular medical care

## 2011-06-27 NOTE — ED Provider Notes (Signed)
Medical screening examination/treatment/procedure(s) were performed by non-physician practitioner and as supervising physician I was immediately available for consultation/collaboration.   Carlosdaniel Grob, MD 06/27/11 2322 

## 2011-06-27 NOTE — ED Provider Notes (Signed)
History     CSN: [id number]  Arrival date & time 06/27/11  1914   None     Chief Complaint  Patient presents with  . Migraine    (Consider location/radiation/quality/duration/timing/severity/associated sxs/prior treatment) Patient is a 36 y.o. female presenting with migraine. The history is provided by the patient.  Migraine This is a chronic problem. The current episode started 1 to 4 weeks ago. The problem occurs constantly. The problem has been gradually worsening. Associated symptoms include arthralgias. Pertinent negatives include no abdominal pain, anorexia, change in bowel habit, chest pain, chills, congestion, coughing, neck pain or numbness.    Past Medical History  Diagnosis Date  . Thyroid disease   . ADD (attention deficit disorder)     Past Surgical History  Procedure Date  . Mouth surgery   . Tubal ligation     Family History  Problem Relation Age of Onset  . Cancer Other   . Heart failure Other     History  Substance Use Topics  . Smoking status: Former Smoker    Quit date: 05/16/2011  . Smokeless tobacco: Not on file  . Alcohol Use:      socially    OB History    Grav Para Term Preterm Abortions TAB SAB Ect Mult Living                  Review of Systems  Constitutional: Positive for unexpected weight change. Negative for chills.  HENT: Negative for congestion and neck pain.   Respiratory: Negative for cough.   Cardiovascular: Negative for chest pain.  Gastrointestinal: Negative for abdominal pain, anorexia and change in bowel habit.  Musculoskeletal: Positive for arthralgias.  Neurological: Negative for numbness.    Allergies  Phenergan  Home Medications   Current Outpatient Rx  Name Route Sig Dispense Refill  . NITROFURANTOIN MACROCRYSTAL 100 MG PO CAPS Oral Take 1 capsule (100 mg total) by mouth once. 7 capsule 0    BP 113/68  Pulse 89  Temp(Src) 97.9 F (36.6 C) (Oral)  Resp 16  SpO2 98%  LMP 04/07/2011  Physical Exam   Constitutional: She is oriented to person, place, and time. She appears well-developed and well-nourished.       Obese patient, states she's gained weight from 162 to205 in the last 3, weeks  HENT:  Head: Normocephalic.  Eyes: Pupils are equal, round, and reactive to light.  Neck: Normal range of motion.  Cardiovascular: Normal rate.   Pulmonary/Chest: Effort normal.  Abdominal: Soft. Bowel sounds are normal. She exhibits no distension. There is no tenderness.  Musculoskeletal: Normal range of motion. She exhibits no edema and no tenderness.  Neurological: She is alert and oriented to person, place, and time.  Skin: Skin is warm. Rash noted. There is pallor.       Chest superficial lesions on her upper extremities, as well as across the front of her chest she, says they've been there for several weeks.  They do not appear infectious    ED Course  Procedures (including critical care time)  Labs Reviewed  URINALYSIS, ROUTINE W REFLEX MICROSCOPIC - Abnormal; Notable for the following:    APPearance HAZY (*)    Hgb urine dipstick TRACE (*)    Nitrite POSITIVE (*)    All other components within normal limits  POCT I-STAT, CHEM 8 - Abnormal; Notable for the following:    BUN 5 (*)    All other components within normal limits  URINE MICROSCOPIC-ADD ON -  Abnormal; Notable for the following:    Squamous Epithelial / LPF FEW (*)    Bacteria, UA MANY (*)    All other components within normal limits  POCT PREGNANCY, URINE  CBC  DIFFERENTIAL   Dg Chest 2 View  06/27/2011  *RADIOLOGY REPORT*  Clinical Data: 6-week history of intermittent chest pain and shortness of breath.  CHEST - 2 VIEW  Comparison: None.  Findings: Cardiomediastinal silhouette unremarkable.   Lungs clear. Bronchovascular markings normal.  Pulmonary vascularity normal.  No pneumothorax.  No pleural effusions.  Mild degenerative changes involving the thoracic spine.  IMPRESSION: No acute or significant abnormality.  Original  Report Authenticated By: Arnell Sieving, M.D.     1. UTI (lower urinary tract infection)   2. Fatigue       MDM  Exam is very nonspecific.  I told her I did not think we would, with a specific diagnosis, but we will rule out many things with blood work, and a chest x-ray.  She is agreeable to that.  Unfortunately, before her evaluation could be completed.  She phone call from a family member, and she needs to leave.  She does have a urinary tract infection.  I discussed this with her and she has been prescribed Macrodantin.  For that and she will follow up with Jovita Kussmaul, [facility] for further evaluation        Arman Filter, NP 06/27/11 2138  Arman Filter, NP 06/27/11 2138

## 2011-06-27 NOTE — ED Notes (Addendum)
C/o tired for 1 month, also has gained wait over last month, tired of being tired, sleeping constantly for last week, developed HA today around 1245, some nausea, (denies: fever, vd). Alert, interactive, calm, NAD. "Not taking ADD meds or thyroid meds". Quit smoking 6 weeks ago & LMP was 3/2.

## 2011-06-27 NOTE — ED Notes (Signed)
Pt alert and oriented, with steady gait at time of discharge. Pt given discharge papers and papers explained. All questions answered and pt walked to discharge.  

## 2011-09-23 ENCOUNTER — Emergency Department (HOSPITAL_COMMUNITY)
Admission: EM | Admit: 2011-09-23 | Discharge: 2011-09-24 | Disposition: A | Discharge status: Home / Self Care | Payer: Medicaid Other | Attending: Emergency Medicine | Admitting: Emergency Medicine

## 2011-09-23 ENCOUNTER — Encounter (HOSPITAL_COMMUNITY): Payer: Self-pay | Admitting: *Deleted

## 2011-09-23 DIAGNOSIS — B9689 Other specified bacterial agents as the cause of diseases classified elsewhere: Secondary | ICD-10-CM | POA: Insufficient documentation

## 2011-09-23 DIAGNOSIS — N760 Acute vaginitis: Secondary | ICD-10-CM | POA: Insufficient documentation

## 2011-09-23 DIAGNOSIS — F988 Other specified behavioral and emotional disorders with onset usually occurring in childhood and adolescence: Secondary | ICD-10-CM | POA: Insufficient documentation

## 2011-09-23 DIAGNOSIS — E079 Disorder of thyroid, unspecified: Secondary | ICD-10-CM | POA: Insufficient documentation

## 2011-09-23 DIAGNOSIS — A499 Bacterial infection, unspecified: Secondary | ICD-10-CM | POA: Insufficient documentation

## 2011-09-23 DIAGNOSIS — R109 Unspecified abdominal pain: Secondary | ICD-10-CM

## 2011-09-23 LAB — URINALYSIS, ROUTINE W REFLEX MICROSCOPIC
Ketones, ur: NEGATIVE mg/dL
Nitrite: NEGATIVE
Protein, ur: NEGATIVE mg/dL
pH: 5.5 (ref 5.0–8.0)

## 2011-09-23 LAB — URINE MICROSCOPIC-ADD ON

## 2011-09-23 NOTE — ED Notes (Signed)
Pt c/o lower abd pain; started a few wks ago on and off.  No urinary problems.  No discharge; nausea no vomiting

## 2011-09-24 ENCOUNTER — Emergency Department (HOSPITAL_COMMUNITY): Payer: Medicaid Other

## 2011-09-24 LAB — WET PREP, GENITAL: Trich, Wet Prep: NONE SEEN

## 2011-09-24 LAB — CBC WITH DIFFERENTIAL/PLATELET
Basophils Relative: 0 % (ref 0–1)
Eosinophils Absolute: 0.2 10*3/uL (ref 0.0–0.7)
MCH: 31.1 pg (ref 26.0–34.0)
MCHC: 34.8 g/dL (ref 30.0–36.0)
Neutrophils Relative %: 50 % (ref 43–77)
Platelets: 213 10*3/uL (ref 150–400)
RDW: 12.8 % (ref 11.5–15.5)

## 2011-09-24 LAB — BASIC METABOLIC PANEL
BUN: 9 mg/dL (ref 6–23)
GFR calc Af Amer: >90 mL/min (ref >90)
GFR calc non Af Amer: >90 mL/min (ref >90)
Potassium: 3.5 mEq/L (ref 3.5–5.1)
Sodium: 140 mEq/L (ref 135–145)

## 2011-09-24 MED ORDER — ONDANSETRON 8 MG PO TBDP: ORAL_TABLET | ORAL | Status: AC

## 2011-09-24 MED ORDER — SODIUM CHLORIDE 0.9 % IV BOLUS (SEPSIS)
1000.0000 mL | Freq: Once | INTRAVENOUS | Status: AC
  Administered 2011-09-24: 1000 mL via INTRAVENOUS

## 2011-09-24 MED ORDER — IOHEXOL 300 MG/ML  SOLN
100.0000 mL | Freq: Once | INTRAMUSCULAR | Status: AC | PRN
  Administered 2011-09-24: 100 mL via INTRAVENOUS

## 2011-09-24 MED ORDER — ONDANSETRON HCL 4 MG/2ML IJ SOLN
4.0000 mg | Freq: Once | INTRAMUSCULAR | Status: AC
  Administered 2011-09-24: 4 mg via INTRAVENOUS
  Filled 2011-09-24: qty 2

## 2011-09-24 MED ORDER — METRONIDAZOLE 500 MG PO TABS: 500.0000 mg | ORAL_TABLET | Freq: Two times a day (BID) | ORAL | Status: AC

## 2011-09-24 MED ORDER — HYDROCODONE-ACETAMINOPHEN 5-325 MG PO TABS: 1.0000 | ORAL_TABLET | ORAL | Status: AC | PRN

## 2011-09-24 MED ORDER — MORPHINE SULFATE 4 MG/ML IJ SOLN
4.0000 mg | Freq: Once | INTRAMUSCULAR | Status: AC
  Administered 2011-09-24: 4 mg via INTRAVENOUS
  Filled 2011-09-24: qty 1

## 2011-09-24 MED ORDER — METOCLOPRAMIDE HCL 5 MG/ML IJ SOLN
10.0000 mg | Freq: Once | INTRAMUSCULAR | Status: AC
  Administered 2011-09-24: 10 mg via INTRAVENOUS
  Filled 2011-09-24: qty 2

## 2011-09-24 MED ORDER — HYDROMORPHONE HCL PF 1 MG/ML IJ SOLN
1.0000 mg | Freq: Once | INTRAMUSCULAR | Status: AC
  Administered 2011-09-24: 1 mg via INTRAVENOUS
  Filled 2011-09-24: qty 1

## 2011-09-24 NOTE — ED Notes (Signed)
Patient transported to CT 

## 2011-09-24 NOTE — ED Provider Notes (Signed)
Medical screening examination/treatment/procedure(s) were performed by non-physician practitioner and as supervising physician I was immediately available for consultation/collaboration.  Olivia Mackie, MD 09/24/11 0730

## 2011-09-24 NOTE — ED Notes (Signed)
Pelvic cart is set up in patients room

## 2011-09-24 NOTE — ED Provider Notes (Signed)
History     CSN: [id number]  Arrival date & time 09/23/11  2226   First MD Initiated Contact with Patient 09/23/11 2332      Chief Complaint  Patient presents with  . Abdominal Pain   HPI  History provided by the patient. Patient is a 35 year old female with no significant PMH who presents with complaints of worsening lower abdominal pain and discomfort. Patient reports having some sharp and aching lower abdominal pains for the past 2 weeks. She states this seemed to gradually improve and resolve completely but then returned today with significant sharp pains. Patient has not taken any medications for her symptoms. Pain does seem worse in certain positions. Patient also has improvement of pains lying on either side or in certain positions. Pain is described as sharp and turning. She feels pain in the suprapubic area radiating bilaterally. She denies any back or flank pains. Patient denies any other significant aggravating or alleviating factors. She denies any other associated symptoms. Denies any fever, chills, sweats, nausea, vomiting, diarrhea or constipation. She denies any dysuria, hematuria, urinary frequency, vaginal bleeding or vaginal discharge. Patient states she did not receive normal menstrual cycle this month.     Past Medical History  Diagnosis Date  . Thyroid disease   . ADD (attention deficit disorder)     Past Surgical History  Procedure Date  . Mouth surgery   . Tubal ligation     Family History  Problem Relation Age of Onset  . Cancer Other   . Heart failure Other     History  Substance Use Topics  . Smoking status: Former Smoker    Quit date: 05/16/2011  . Smokeless tobacco: Not on file  . Alcohol Use:      socially    OB History    Grav Para Term Preterm Abortions TAB SAB Ect Mult Living                  Review of Systems  Constitutional: Positive for appetite change. Negative for fever and chills.  Respiratory: Negative for cough and  shortness of breath.   Cardiovascular: Negative for chest pain.  Gastrointestinal: Positive for abdominal pain. Negative for nausea, vomiting, diarrhea and constipation.  Genitourinary: Negative for dysuria, frequency, hematuria, flank pain, vaginal bleeding and vaginal discharge.  Musculoskeletal: Negative for back pain.    Allergies  Phenergan  Home Medications  No current outpatient prescriptions on file.  BP 134/81  Pulse 91  Temp 97.9 F (36.6 C) (Oral)  Resp 18  SpO2 99%  LMP 08/06/2011  Physical Exam  Nursing note and vitals reviewed. Constitutional: She is oriented to person, place, and time. She appears well-developed and well-nourished. No distress.  HENT:  Head: Normocephalic.  Cardiovascular: Normal rate and regular rhythm.   Pulmonary/Chest: Effort normal and breath sounds normal. No respiratory distress. She has no wheezes. She has no rales.  Abdominal: Soft. There is tenderness in the suprapubic area. There is rebound. There is no guarding, no CVA tenderness, no tenderness at McBurney's point and negative Murphy's sign.       Diffuse abdominal tenderness greatest over suprapubic area. Patient does appear to have some rebounding tenderness. No significant pains over McBurney's point and no Rovsing sign. Negative Murphy's sign.  Genitourinary: Cervix exhibits discharge and friability. Cervix exhibits no motion tenderness. Right adnexum displays no mass, no tenderness and no fullness. Left adnexum displays no mass, no tenderness and no fullness.       Chaperone was  present. Mild to moderate thick white discharge.  Neurological: She is alert and oriented to person, place, and time.  Skin: Skin is warm and dry. No rash noted.  Psychiatric: She has a normal mood and affect. Her behavior is normal.    ED Course  Procedures   Results for orders placed during the hospital encounter of 09/23/11  URINALYSIS, ROUTINE W REFLEX MICROSCOPIC      Component Value Range    Color, Urine YELLOW  YELLOW   APPearance CLOUDY (*) CLEAR   Specific Gravity, Urine 1.020  1.005 - 1.030   pH 5.5  5.0 - 8.0   Glucose, UA NEGATIVE  NEGATIVE mg/dL   Hgb urine dipstick TRACE (*) NEGATIVE   Bilirubin Urine NEGATIVE  NEGATIVE   Ketones, ur NEGATIVE  NEGATIVE mg/dL   Protein, ur NEGATIVE  NEGATIVE mg/dL   Urobilinogen, UA 0.2  0.0 - 1.0 mg/dL   Nitrite NEGATIVE  NEGATIVE   Leukocytes, UA MODERATE (*) NEGATIVE  PREGNANCY, URINE      Component Value Range   Preg Test, Ur NEGATIVE  NEGATIVE  URINE MICROSCOPIC-ADD ON      Component Value Range   Squamous Epithelial / LPF FEW (*) RARE   WBC, UA 0-2  <3 WBC/hpf   RBC / HPF 0-2  <3 RBC/hpf   Bacteria, UA MANY (*) RARE   Urine-Other MUCOUS PRESENT    WET PREP, GENITAL      Component Value Range   Yeast Wet Prep HPF POC NONE SEEN  NONE SEEN   Trich, Wet Prep NONE SEEN  NONE SEEN   Clue Cells Wet Prep HPF POC MANY (*) NONE SEEN   WBC, Wet Prep HPF POC FEW (*) NONE SEEN  CBC WITH DIFFERENTIAL      Component Value Range   WBC 7.5  4.0 - 10.5 K/uL   RBC 4.28  3.87 - 5.11 MIL/uL   Hemoglobin 13.3  12.0 - 15.0 g/dL   HCT 13.2  44.0 - 10.2 %   MCV 89.3  78.0 - 100.0 fL   MCH 31.1  26.0 - 34.0 pg   MCHC 34.8  30.0 - 36.0 g/dL   RDW 72.5  36.6 - 44.0 %   Platelets 213  150 - 400 K/uL   Neutrophils Relative 50  43 - 77 %   Neutro Abs 3.7  1.7 - 7.7 K/uL   Lymphocytes Relative 42  12 - 46 %   Lymphs Abs 3.1  0.7 - 4.0 K/uL   Monocytes Relative 6  3 - 12 %   Monocytes Absolute 0.4  0.1 - 1.0 K/uL   Eosinophils Relative 3  0 - 5 %   Eosinophils Absolute 0.2  0.0 - 0.7 K/uL   Basophils Relative 0  0 - 1 %   Basophils Absolute 0.0  0.0 - 0.1 K/uL  BASIC METABOLIC PANEL      Component Value Range   Sodium 140  135 - 145 mEq/L   Potassium 3.5  3.5 - 5.1 mEq/L   Chloride 107  96 - 112 mEq/L   CO2 24  19 - 32 mEq/L   Glucose, Bld 82  70 - 99 mg/dL   BUN 9  6 - 23 mg/dL   Creatinine, Ser 3.47  0.50 - 1.10 mg/dL   Calcium  8.8  8.4 - 42.5 mg/dL   GFR calc non Af Amer >90  >90 mL/min   GFR calc Af Amer >90  >90 mL/min  Ct Abdomen Pelvis W Contrast  09/24/2011  *RADIOLOGY REPORT*  Clinical Data: Lower abdominal pain, worse on the right.  White cell count 7.5.  Red and white cells in urine.  CT ABDOMEN AND PELVIS WITH CONTRAST  Technique:  Multidetector CT imaging of the abdomen and pelvis was performed following the standard protocol during bolus administration of intravenous contrast.  Contrast: OMNIPAQUE IOHEXOL 300 MG/ML  SOLN  Comparison: 08/03/2007  Findings: Dependent atelectasis in the lung bases.  The liver, spleen, gallbladder, pancreas, adrenal glands, kidneys, abdominal aorta, and retroperitoneal lymph nodes are unremarkable. The stomach, small bowel, and colon are not abnormally distended. No wall thickening is appreciated.  No free air or free fluid in the abdomen.  Pelvis:  The appendix is normal.  Uterus and adnexal structures are not enlarged.  There appears to be a small amount of free fluid in the pelvis which might be physiologic.  The bladder wall is not thickened.  No loculated fluid collections.  No significant pelvic lymphadenopathy.  Diverticula in the sigmoid colon without diverticulitis.  Normal alignment of the lumbar vertebra with mild degenerative change.  IMPRESSION: No acute process demonstrated in the abdomen or pelvis.  Appendix is normal.  Small amount of free fluid in the pelvis is likely to be physiologic.  Original Report Authenticated By: Marlon Pel, M.D.     1. Abdominal pain   2. Bacterial vaginosis       MDM  12:10 AM patient seen and evaluated. Patient in mild to moderate discomfort. Pain is greatest over suprapubic area although patient has diffuse abdominal tenderness. Some rebounding.   Patient reports having significant improvement of pains. She is still having some soreness. CT scan unremarkable. Normal appendix. Labs also unremarkable. There are  signs for bacterial vaginosis. Will give prescription for Flagyl.  I discussed findings with patient. Will give small prescription for Norco for pain and Zofran for any developing nausea symptoms. Patient advised to have 48 hour recheck and followup with PCP.       Angus Seller, [place] 09/24/11 [phone]

## 2011-09-25 LAB — GC/CHLAMYDIA PROBE AMP, GENITAL
Chlamydia, DNA Probe: NEGATIVE
GC Probe Amp, Genital: NEGATIVE

## 2011-10-06 ENCOUNTER — Encounter (HOSPITAL_COMMUNITY): Payer: Self-pay | Admitting: Emergency Medicine

## 2011-10-06 ENCOUNTER — Encounter (HOSPITAL_COMMUNITY): Payer: Self-pay | Admitting: *Deleted

## 2011-10-06 ENCOUNTER — Emergency Department (HOSPITAL_COMMUNITY)
Admission: EM | Admit: 2011-10-06 | Discharge: 2011-10-06 | Disposition: A | Discharge status: Other Acute Inpatient Hospital | Payer: Medicaid Other | Source: Home / Self Care | Attending: Family Medicine | Admitting: Family Medicine

## 2011-10-06 DIAGNOSIS — H9319 Tinnitus, unspecified ear: Secondary | ICD-10-CM | POA: Insufficient documentation

## 2011-10-06 DIAGNOSIS — H9193 Unspecified hearing loss, bilateral: Secondary | ICD-10-CM

## 2011-10-06 DIAGNOSIS — Z87891 Personal history of nicotine dependence: Secondary | ICD-10-CM | POA: Insufficient documentation

## 2011-10-06 DIAGNOSIS — F319 Bipolar disorder, unspecified: Secondary | ICD-10-CM | POA: Insufficient documentation

## 2011-10-06 DIAGNOSIS — F988 Other specified behavioral and emotional disorders with onset usually occurring in childhood and adolescence: Secondary | ICD-10-CM | POA: Insufficient documentation

## 2011-10-06 DIAGNOSIS — E079 Disorder of thyroid, unspecified: Secondary | ICD-10-CM | POA: Insufficient documentation

## 2011-10-06 DIAGNOSIS — R51 Headache: Secondary | ICD-10-CM | POA: Insufficient documentation

## 2011-10-06 HISTORY — DX: Bipolar disorder, unspecified: F31.9

## 2011-10-06 NOTE — ED Provider Notes (Signed)
History     CSN: [id number]  Arrival date & time 10/06/11  5784   First MD Initiated Contact with Patient 10/06/11 1940      Chief Complaint  Patient presents with  . Hearing Problem    (Consider location/radiation/quality/duration/timing/severity/associated sxs/prior treatment) Patient is a 36 y.o. female presenting with ear pain. The history is provided by the patient.  Otalgia This is a new problem. The current episode started 6 to 12 hours ago (onset with high pitched ringing in right ear then hearing loss.). There is pain in both (onset this am in right ear, gradually developing in left ear.) ears. The problem has been gradually worsening. There has been no fever. The patient is experiencing no pain. Associated symptoms include hearing loss. Pertinent negatives include no ear discharge, no headaches, no rhinorrhea and no sore throat. Her past medical history does not include hearing loss.    Past Medical History  Diagnosis Date  . Thyroid disease   . ADD (attention deficit disorder)   . Bipolar 1 disorder     Past Surgical History  Procedure Date  . Mouth surgery   . Tubal ligation     Family History  Problem Relation Age of Onset  . Cancer Other   . Heart failure Other     History  Substance Use Topics  . Smoking status: Former Smoker    Quit date: 05/16/2011  . Smokeless tobacco: Not on file  . Alcohol Use:      socially    OB History    Grav Para Term Preterm Abortions TAB SAB Ect Mult Living                  Review of Systems  Constitutional: Negative.   HENT: Positive for hearing loss, ear pain and tinnitus. Negative for nosebleeds, congestion, sore throat, rhinorrhea, postnasal drip and ear discharge.   Eyes: Negative.   Neurological: Negative for headaches.    Allergies  Phenergan  Home Medications  No current outpatient prescriptions on file.  BP 109/70  Pulse 90  Temp 98.4 F (36.9 C) (Oral)  Resp 18  SpO2 99%  LMP  10/02/2011  Physical Exam  Nursing note and vitals reviewed. Constitutional: She is oriented to person, place, and time. She appears well-developed and well-nourished.  HENT:  Head: Normocephalic.  Right Ear: Tympanic membrane, external ear and ear canal normal. No drainage, swelling or tenderness. No middle ear effusion. Decreased hearing is noted.  Left Ear: Tympanic membrane, external ear and ear canal normal. No drainage, swelling or tenderness.  No middle ear effusion. Decreased hearing is noted.  Nose: Nose normal.  Mouth/Throat: Oropharynx is clear and moist.  Eyes: Pupils are equal, round, and reactive to light.  Neck: Normal range of motion. Neck supple.  Lymphadenopathy:    She has no cervical adenopathy.  Neurological: She is alert and oriented to person, place, and time. No cranial nerve deficit.  Skin: Skin is warm and dry.    ED Course  Procedures (including critical care time)  Labs Reviewed - No data to display No results found.   1. Hearing loss of both ears       MDM          Linna Hoff, MD 10/06/11 2036

## 2011-10-06 NOTE — ED Notes (Signed)
Pt c/o right hearing loss since this morning and states it's slowly moving to the left ear.... Also states that her right side of face is numb... Woke up this morning hearing a high pitch noise... Some pain associated in right ear.

## 2011-10-06 NOTE — ED Notes (Signed)
Pt states that she woke up this morning and was having ringing in her right ear, the ringing went away and then she felt like she couldn't hear as well in her right ear. Pt states she then felt pain that went across the back of her head and her left ear started hurting her at work. Pt states she is having a HA as well. Pt states it feels like she is swimming, and in a fog. Pt ambulatory, denies weakness, able to follow commands and move extrexmities.

## 2011-10-07 ENCOUNTER — Emergency Department (HOSPITAL_COMMUNITY)
Admission: EM | Admit: 2011-10-07 | Discharge: 2011-10-07 | Disposition: A | Discharge status: Home / Self Care | Payer: Medicaid Other | Attending: Emergency Medicine | Admitting: Emergency Medicine

## 2011-10-07 DIAGNOSIS — H9319 Tinnitus, unspecified ear: Secondary | ICD-10-CM

## 2011-10-07 DIAGNOSIS — R51 Headache: Secondary | ICD-10-CM

## 2011-10-07 MED ORDER — OXYCODONE-ACETAMINOPHEN 5-325 MG PO TABS: 2.0000 | ORAL_TABLET | ORAL | Status: AC | PRN

## 2011-10-07 MED ORDER — MECLIZINE HCL 25 MG PO TABS
25.0000 mg | ORAL_TABLET | Freq: Once | ORAL | Status: AC
  Administered 2011-10-07: 25 mg via ORAL
  Filled 2011-10-07: qty 1

## 2011-10-07 MED ORDER — OXYCODONE-ACETAMINOPHEN 5-325 MG PO TABS
2.0000 | ORAL_TABLET | Freq: Once | ORAL | Status: AC
  Administered 2011-10-07: 2 via ORAL
  Filled 2011-10-07: qty 2

## 2011-10-07 MED ORDER — MECLIZINE HCL 50 MG PO TABS: 50.0000 mg | ORAL_TABLET | Freq: Three times a day (TID) | ORAL | Status: AC | PRN

## 2011-10-07 NOTE — ED Notes (Addendum)
Pt reports hearing a ringing in her right ear followed by complete hearing loss in right ear.  She also reports "muffled" sounds in the left.  States, "It feels like I'm in a tunnel.  The pain shoots from the middle of my ear to the right side of my head.  The pain is not constant, it just shoots."  Pt also reports pressure behind her eyes which she describes as her being at the bottom of a swimming pool.    Nurse witnessed pt ambulating to the room with normal gait.  Pt states she felt like she was leaning while walking.    Pt was seen at Urgent Care last night and was referred to the ED.

## 2011-10-07 NOTE — ED Provider Notes (Signed)
History     CSN: [id number]  Arrival date & time 10/06/11  2234   First MD Initiated Contact with Patient 10/07/11 [phone]      Chief Complaint  Patient presents with  . Tinnitus  . Headache    (Consider location/radiation/quality/duration/timing/severity/associated sxs/prior treatment) Patient is a 36 y.o. female presenting with headaches. The history is provided by the patient.  Headache  Pertinent negatives include no fever, no nausea and no vomiting.   36 year old, female, with a history of ADD, presents emergency department complaining of a headache, and ringing in her right ear for the past several days.  She says her headache, feels like a pressure, or as if she were on the bottom of a poor.  It is not associated with nausea, vomiting, fevers, chills, vision changes, neck pain, rash, or weakness.  She denies recent URI.  She denies trauma, or discharge from her ears.  She denies pain anywhere other than her headache.  Past Medical History  Diagnosis Date  . Thyroid disease   . ADD (attention deficit disorder)   . Bipolar 1 disorder     Past Surgical History  Procedure Date  . Mouth surgery   . Tubal ligation     Family History  Problem Relation Age of Onset  . Cancer Other   . Heart failure Other     History  Substance Use Topics  . Smoking status: Former Smoker    Quit date: 05/16/2011  . Smokeless tobacco: Not on file  . Alcohol Use: Yes     socially    OB History    Grav Para Term Preterm Abortions TAB SAB Ect Mult Living                  Review of Systems  Constitutional: Negative for fever, chills and diaphoresis.  HENT: Negative for ear pain, congestion, neck pain and neck stiffness.   Eyes: Negative for photophobia and visual disturbance.  Cardiovascular: Negative for chest pain.  Gastrointestinal: Negative for nausea and vomiting.  Musculoskeletal: Negative for back pain.  Skin: Negative for rash.  Neurological: Positive for headaches.  Negative for dizziness and light-headedness.  Psychiatric/Behavioral: Negative for confusion.  All other systems reviewed and are negative.    Allergies  Phenergan  Home Medications  No current outpatient prescriptions on file.  BP 116/54  Pulse 72  Temp 97.5 F (36.4 C) (Oral)  Resp 16  SpO2 98%  LMP 10/02/2011  Physical Exam  Nursing note and vitals reviewed. Constitutional: She is oriented to person, place, and time. She appears well-developed and well-nourished. No distress.  HENT:  Head: Normocephalic and atraumatic.  Right Ear: External ear normal.  Left Ear: External ear normal.       Tender  over her frontal sinuses  Eyes: Conjunctivae and EOM are normal.  Neck: Normal range of motion. Neck supple.  Cardiovascular: Normal rate.   Pulmonary/Chest: Effort normal and breath sounds normal.  Abdominal: She exhibits no distension.  Musculoskeletal: Normal range of motion.  Neurological: She is alert and oriented to person, place, and time. No cranial nerve deficit.  Skin: Skin is warm and dry.  Psychiatric: She has a normal mood and affect. Thought content normal.    ED Course  Procedures (including critical care time) headache, and tinnitus.  No evidence of altered mental status.  Neurological deficit.  No evidence of CNS infection or systemic illness.  Labs Reviewed - No data to display No results found.   No diagnosis  found.    MDM  Headache with tinnintus        Cheri Guppy, MD 10/07/11 [phone]

## 2012-01-31 ENCOUNTER — Emergency Department (HOSPITAL_COMMUNITY): Payer: Medicaid Other

## 2012-01-31 ENCOUNTER — Emergency Department (HOSPITAL_COMMUNITY)
Admission: EM | Admit: 2012-01-31 | Discharge: 2012-02-01 | Disposition: A | Discharge status: Home / Self Care | Payer: Medicaid Other | Attending: Emergency Medicine | Admitting: Emergency Medicine

## 2012-01-31 ENCOUNTER — Encounter (HOSPITAL_COMMUNITY): Payer: Self-pay | Admitting: *Deleted

## 2012-01-31 DIAGNOSIS — R109 Unspecified abdominal pain: Secondary | ICD-10-CM | POA: Insufficient documentation

## 2012-01-31 DIAGNOSIS — Z9851 Tubal ligation status: Secondary | ICD-10-CM | POA: Insufficient documentation

## 2012-01-31 DIAGNOSIS — E079 Disorder of thyroid, unspecified: Secondary | ICD-10-CM | POA: Insufficient documentation

## 2012-01-31 DIAGNOSIS — F988 Other specified behavioral and emotional disorders with onset usually occurring in childhood and adolescence: Secondary | ICD-10-CM | POA: Insufficient documentation

## 2012-01-31 DIAGNOSIS — Z79899 Other long term (current) drug therapy: Secondary | ICD-10-CM | POA: Insufficient documentation

## 2012-01-31 DIAGNOSIS — Z9889 Other specified postprocedural states: Secondary | ICD-10-CM | POA: Insufficient documentation

## 2012-01-31 DIAGNOSIS — F319 Bipolar disorder, unspecified: Secondary | ICD-10-CM | POA: Insufficient documentation

## 2012-01-31 DIAGNOSIS — Z87891 Personal history of nicotine dependence: Secondary | ICD-10-CM | POA: Insufficient documentation

## 2012-01-31 DIAGNOSIS — I951 Orthostatic hypotension: Secondary | ICD-10-CM

## 2012-01-31 DIAGNOSIS — Z3202 Encounter for pregnancy test, result negative: Secondary | ICD-10-CM | POA: Insufficient documentation

## 2012-01-31 LAB — COMPREHENSIVE METABOLIC PANEL
ALT: 10 U/L (ref 0–35)
Alkaline Phosphatase: 52 U/L (ref 39–117)
BUN: 10 mg/dL (ref 6–23)
Chloride: 103 mEq/L (ref 96–112)
GFR calc Af Amer: >90 mL/min (ref >90)
Glucose, Bld: 81 mg/dL (ref 70–99)
Potassium: 3.4 mEq/L — ABNORMAL LOW (ref 3.5–5.1)
Sodium: 138 mEq/L (ref 135–145)
Total Bilirubin: 0.2 mg/dL — ABNORMAL LOW (ref 0.3–1.2)
Total Protein: 7.1 g/dL (ref 6.0–8.3)

## 2012-01-31 LAB — CBC WITH DIFFERENTIAL/PLATELET
Basophils Absolute: 0 10*3/uL (ref 0.0–0.1)
Basophils Relative: 0 % (ref 0–1)
Eosinophils Relative: 1 % (ref 0–5)
HCT: 38.2 % (ref 36.0–46.0)
MCHC: 34.6 g/dL (ref 30.0–36.0)
MCV: 88.6 fL (ref 78.0–100.0)
Monocytes Absolute: 0.4 10*3/uL (ref 0.1–1.0)
Neutro Abs: 4.2 10*3/uL (ref 1.7–7.7)
Platelets: 263 10*3/uL (ref 150–400)
RDW: 12.5 % (ref 11.5–15.5)
WBC: 7.3 10*3/uL (ref 4.0–10.5)

## 2012-01-31 LAB — POCT PREGNANCY, URINE: Preg Test, Ur: NEGATIVE

## 2012-01-31 LAB — RAPID URINE DRUG SCREEN, HOSP PERFORMED
Amphetamines: POSITIVE — AB
Barbiturates: NOT DETECTED
Tetrahydrocannabinol: NOT DETECTED

## 2012-01-31 LAB — URINALYSIS, ROUTINE W REFLEX MICROSCOPIC
Bilirubin Urine: NEGATIVE
Nitrite: NEGATIVE
Protein, ur: NEGATIVE mg/dL
Urobilinogen, UA: 1 mg/dL (ref 0.0–1.0)

## 2012-01-31 LAB — URINE MICROSCOPIC-ADD ON

## 2012-01-31 LAB — LIPASE, BLOOD: Lipase: 24 U/L (ref 11–59)

## 2012-01-31 MED ORDER — DIPHENHYDRAMINE HCL 50 MG/ML IJ SOLN
25.0000 mg | Freq: Once | INTRAMUSCULAR | Status: AC
  Administered 2012-01-31: 25 mg via INTRAVENOUS
  Filled 2012-01-31: qty 1

## 2012-01-31 MED ORDER — METOCLOPRAMIDE HCL 5 MG/ML IJ SOLN
10.0000 mg | Freq: Once | INTRAMUSCULAR | Status: AC
  Administered 2012-01-31: 10 mg via INTRAVENOUS
  Filled 2012-01-31: qty 2

## 2012-01-31 MED ORDER — SODIUM CHLORIDE 0.9 % IV BOLUS (SEPSIS)
1000.0000 mL | Freq: Once | INTRAVENOUS | Status: AC
  Administered 2012-01-31: 1000 mL via INTRAVENOUS

## 2012-01-31 MED ORDER — HYDROMORPHONE HCL PF 1 MG/ML IJ SOLN
1.0000 mg | Freq: Once | INTRAMUSCULAR | Status: AC
  Administered 2012-01-31: 1 mg via INTRAVENOUS
  Filled 2012-01-31: qty 1

## 2012-01-31 NOTE — ED Provider Notes (Signed)
History     CSN: [id number]  Arrival date & time 01/31/12  2014   First MD Initiated Contact with Patient 01/31/12 2158      Chief Complaint  Patient presents with  . Abdominal Pain    (Consider location/radiation/quality/duration/timing/severity/associated sxs/prior treatment) HPI This 36 year old female states she worked all day today and went back to the shelter where she is staying and she laid on the couch to take a nap because she's had some slight nausea all day, when she woke up from her nap she found that she was on the floor and EMS was there someone had called EMS, she states with EMS standing over her close to her face she became angry, she now states she has had a gradual onset over the last hour or so of a global tingling type headache as well as bilateral lower abdominal pain with continued mild nausea but no vomiting or diarrhea no dysuria and no vaginal bleeding or no vaginal discharge. She denies chest pain palpitations cough or shortness of breath. She denies any trauma or fever. She is no neck pain or back pain. Her arms feel normal. She feels like both of her legs are weak and wobbly and feel like rubber she states she is unable to stand and walk now due to weakness in both legs. She describes both lightheadedness as well as sensation of motion with possible vertigo prior to arrival since she woke up from her nap. She was last known well just prior to arrival but this is not an obvious stroke syndrome upon arrival. She denies any threats herself or others denies suicidal or homicidal ideation or hallucinations. She has had multiple bad headaches in the past but this headache is unlike her prior headaches. She did not wake up with a headache from her nap. She developed it after she woke up, the same with her abdominal pain.  As far as the patient can tell she gradual onset of her symptoms at about 745 this evening consisting of her combination of headache, abdominal pain, and  weakness in both legs. Past Medical History  Diagnosis Date  . Thyroid disease   . ADD (attention deficit disorder)   . Bipolar 1 disorder     Past Surgical History  Procedure Date  . Mouth surgery   . Tubal ligation     Family History  Problem Relation Age of Onset  . Cancer Other   . Heart failure Other     History  Substance Use Topics  . Smoking status: Former Smoker    Quit date: 05/16/2011  . Smokeless tobacco: Never Used  . Alcohol Use: Yes     Comment: socially    OB History    Grav Para Term Preterm Abortions TAB SAB Ect Mult Living                  Review of Systems 10 Systems reviewed and are negative for acute change except as noted in the HPI. Allergies  Phenergan  Home Medications   Current Outpatient Rx  Name  Route  Sig  Dispense  Refill  . AMPHETAMINE-DEXTROAMPHETAMINE 20 MG PO TABS   Oral   Take 20 mg by mouth daily.         Marland Kitchen CITALOPRAM HYDROBROMIDE 20 MG PO TABS   Oral   Take 20 mg by mouth daily.         Marland Kitchen LEVOTHYROXINE SODIUM 50 MCG PO TABS   Oral   Take 50  mcg by mouth daily.         Marland Kitchen METRONIDAZOLE 500 MG PO TABS   Oral   Take 1 tablet (500 mg total) by mouth 2 (two) times daily.   14 tablet   0   . ONDANSETRON HCL 4 MG PO TABS   Oral   Take 1 tablet (4 mg total) by mouth every 6 (six) hours.   6 tablet   0   . TRAMADOL-ACETAMINOPHEN 37.5-325 MG PO TABS      2 tabs po QID prn pain   20 tablet   0     BP 108/58  Pulse 78  Temp 98.3 F (36.8 C) (Oral)  Resp 18  SpO2 98%  LMP 01/30/2012  Physical Exam  Nursing note and vitals reviewed. Constitutional: She is oriented to person, place, and time.       Awake, alert, nontoxic appearance with baseline speech for patient.  HENT:  Head: Atraumatic.  Mouth/Throat: No oropharyngeal exudate.       No nystagmus or disconjugate gaze with extraocular movements and when eyes are uncovered individually  Eyes: EOM are normal. Pupils are equal, round, and reactive  to light. Right eye exhibits no discharge. Left eye exhibits no discharge.  Neck: Neck supple.       C-S NT  Cardiovascular: Normal rate and regular rhythm.   No murmur heard. Pulmonary/Chest: Effort normal and breath sounds normal. No stridor. No respiratory distress. She has no wheezes. She has no rales. She exhibits no tenderness.  Abdominal: Soft. Bowel sounds are normal. She exhibits no mass. There is tenderness. There is no rebound.       Minimal tenderness to her right lower quadrant suprapubic and left lower quadrant areas to her abdomen with her upper abdomen nontender  Musculoskeletal: She exhibits no edema and no tenderness.       Baseline ROM, moves extremities with no obvious new focal weakness.  Lymphadenopathy:    She has no cervical adenopathy.  Neurological: She is alert and oriented to person, place, and time.       Awake, alert, cooperative and aware of situation; motor strength 5/5 arms bilaterally; sensation normal to light touch bilaterally; peripheral visual fields full to confrontation; no facial asymmetry; tongue midline; major cranial nerves appear intact; no pronator drift arms or legs, normal finger to nose bilaterally, however patient has weakness to both legs with strength 4-5 bilaterally in her legs with strength 5 out of 5 in her arms  Skin: No rash noted.  Psychiatric: She has a normal mood and affect.   Chaperone is present for bimanual examination, the patient is just finishing her menstrual cycle, there is scant dark blood in the examination glove is no discharge, she has no cervical motion tenderness no adnexal tenderness no palpable masses.  ED Course  Procedures (including critical care time) ECG: Normal sinus rhythm, ventricular rate 82, normal axis, normal intervals, no acute ischemic changes noted, impression normal ECG, no significant change compared with June 2004   Patient / Family / Caregiver understand and agree with initial ED impression and plan  with expectations set for ED visit.  The patient states over 10 years ago she was admitted to the hospital for about a week for unexplained hypotension that might have been attributed to her thyroid but has not been evaluated by Dr. for recurrent hypotension since that time.  I was initially unaware of the Pt's hypotension, then ordered IVF bolus upon re-checking the Pt and noting  her VS.  Added TSH and lactate to labs. CT ordered abd/pelvis due to pain/hypotension.  CRITICAL CARE Performed by: Hurman Horn   Total critical care time:  Critical care time was exclusive of separately billable procedures and treating other patients.  Critical care was necessary to treat or prevent imminent or life-threatening deterioration.  Critical care was time spent personally by me on the following activities: development of treatment plan with patient and/or surrogate as well as nursing, discussions with consultants, evaluation of patient's response to treatment, examination of patient, obtaining history from patient or surrogate, ordering and performing treatments and interventions, ordering and review of laboratory studies, ordering and review of radiographic studies, pulse oximetry and re-evaluation of patient's condition.  Endorsing care/disposition to Dr. Lars Mage. 0115   Labs Reviewed  COMPREHENSIVE METABOLIC PANEL - Abnormal; Notable for the following:    Potassium 3.4 (*)     Total Bilirubin 0.2 (*)     All other components within normal limits  URINALYSIS, ROUTINE W REFLEX MICROSCOPIC - Abnormal; Notable for the following:    Hgb urine dipstick LARGE (*)     Ketones, ur TRACE (*)     All other components within normal limits  URINE RAPID DRUG SCREEN (HOSP PERFORMED) - Abnormal; Notable for the following:    Amphetamines POSITIVE (*)     All other components within normal limits  URINE MICROSCOPIC-ADD ON - Abnormal; Notable for the following:    Squamous Epithelial / LPF FEW (*)       Bacteria, UA FEW (*)     All other components within normal limits  CBC WITH DIFFERENTIAL  LIPASE, BLOOD  ETHANOL  POCT PREGNANCY, URINE  LACTIC ACID, PLASMA  TSH  URINE CULTURE   Ct Head Wo Contrast  01/31/2012  *RADIOLOGY REPORT*  Clinical Data: 36 year old female with altered mental status.  CT HEAD WITHOUT CONTRAST  Technique:  Contiguous axial images were obtained from the base of the skull through the vertex without contrast.  Comparison: 06/18/2009  Findings: No intracranial abnormalities are identified, including mass lesion or mass effect, hydrocephalus, extra-axial fluid collection, midline shift, hemorrhage, or acute infarction.  The visualized bony calvarium is unremarkable.  IMPRESSION: Unremarkable noncontrast head CT.   Original Report Authenticated By: Harmon Pier, M.D.    Ct Abdomen Pelvis W Contrast  02/01/2012  *RADIOLOGY REPORT*  Clinical Data: Stomach pain; hypotension.  CT ABDOMEN AND PELVIS WITH CONTRAST  Technique:  Multidetector CT imaging of the abdomen and pelvis was performed following the standard protocol during bolus administration of intravenous contrast.  Contrast: OMNIPAQUE IOHEXOL 300 MG/ML  SOLN  Comparison: CT of the abdomen and pelvis performed 09/24/2011  Findings: Mild right basilar atelectasis is noted.  The liver and spleen are unremarkable in appearance.  The gallbladder is within normal limits.  The pancreas and adrenal glands are unremarkable.  The kidneys are unremarkable in appearance.  There is no evidence of hydronephrosis.  No renal or ureteral stones are seen.  No perinephric stranding is appreciated.  No free fluid is identified.  The small bowel is unremarkable in appearance.  The stomach is within normal limits.  No acute vascular abnormalities are seen.  The appendix is normal in caliber and contains contrast, without evidence for appendicitis.  The colon is filled with contrast and is unremarkable in appearance.  The bladder is mildly  distended and grossly unremarkable in appearance.  The uterus is within normal limits.  The ovaries are relatively symmetric; no suspicious adnexal  masses are seen.  No inguinal lymphadenopathy is seen.  No acute osseous abnormalities are identified.  IMPRESSION:  1.  No acute abnormalities identified within the abdomen or pelvis. 2.  Mild right basilar atelectasis noted.   Original Report Authenticated By: Tonia Ghent, M.D.      1. Abdominal pain   2. Orthostatic hypotension       MDM  Dispo pending and CT result pending when care endorsed to Dr. Lynelle Doctor.        Hurman Horn, MD 02/01/12 1600

## 2012-01-31 NOTE — ED Notes (Signed)
Patient states that she is unable to stand because she is really weak and her legs feel like "noodles"

## 2012-01-31 NOTE — ED Notes (Addendum)
PER EMS- pt picked up from [facility] with c/o unresponsiveness.  Reports that pt told one of the fireman her stomachs hurts and she hasn't been feeling well on day.  Mother stated on phone that pt has hz of hypothyroidism and alcoholism.  Pt alert and oriented on arrival to ED. Upon EMS arrival pt was combative and non compliant with questioning.

## 2012-01-31 NOTE — ED Notes (Signed)
UJW:[id number]<YN> Expected date:01/31/12<BR> Expected time: 8:01 PM<BR> Means of arrival:Ambulance<BR> Comments:<BR> abd pain;

## 2012-02-01 ENCOUNTER — Emergency Department (HOSPITAL_COMMUNITY): Payer: Medicaid Other

## 2012-02-01 LAB — LACTIC ACID, PLASMA: Lactic Acid, Venous: 1.2 mmol/L (ref 0.5–2.2)

## 2012-02-01 MED ORDER — SODIUM CHLORIDE 0.9 % IV BOLUS (SEPSIS)
2000.0000 mL | Freq: Once | INTRAVENOUS | Status: AC
  Administered 2012-02-01: 1000 mL via INTRAVENOUS

## 2012-02-01 MED ORDER — TRAMADOL-ACETAMINOPHEN 37.5-325 MG PO TABS: ORAL_TABLET | ORAL | Status: DC

## 2012-02-01 MED ORDER — ONDANSETRON HCL 4 MG PO TABS: 4.0000 mg | ORAL_TABLET | Freq: Four times a day (QID) | ORAL | Status: DC

## 2012-02-01 MED ORDER — SODIUM CHLORIDE 0.9 % IV BOLUS (SEPSIS): 500.0000 mL | Freq: Once | INTRAVENOUS | Status: DC

## 2012-02-01 MED ORDER — SODIUM CHLORIDE 0.9 % IV SOLN: 1000.0000 mL | Freq: Once | INTRAVENOUS | Status: DC

## 2012-02-01 MED ORDER — METRONIDAZOLE 500 MG PO TABS: 500.0000 mg | ORAL_TABLET | Freq: Two times a day (BID) | ORAL | Status: DC

## 2012-02-01 MED ORDER — FENTANYL CITRATE 0.05 MG/ML IJ SOLN
50.0000 ug | Freq: Once | INTRAMUSCULAR | Status: AC
  Administered 2012-02-01: 50 ug via INTRAVENOUS
  Filled 2012-02-01: qty 2

## 2012-02-01 MED ORDER — SODIUM CHLORIDE 0.9 % IV SOLN
1000.0000 mL | Freq: Once | INTRAVENOUS | Status: AC
  Administered 2012-02-01: 1000 mL via INTRAVENOUS

## 2012-02-01 MED ORDER — SODIUM CHLORIDE 0.9 % IV SOLN
INTRAVENOUS | Status: DC
  Administered 2012-02-01: 07:00:00 via INTRAVENOUS

## 2012-02-01 MED ORDER — IOHEXOL 300 MG/ML  SOLN
100.0000 mL | Freq: Once | INTRAMUSCULAR | Status: AC | PRN
  Administered 2012-02-01: 100 mL via INTRAVENOUS

## 2012-02-01 NOTE — ED Provider Notes (Signed)
Pt signed out that d/c instructions done, that pt is finishing up her iv fluids. Pt alert, content, nad, tolerating po fluids. Suzi Roots, MD 02/01/12 [phone]

## 2012-02-01 NOTE — ED Notes (Signed)
Patient transported to CT 

## 2012-02-01 NOTE — ED Provider Notes (Signed)
Patient left with me at change of shift by Dr. Fonnie Jarvis to check her abdominal CT scan and orthostatics. CT scans noted. Patient has improved orthostatic blood pressures however she still may be in the high 90 systolic. She reports however she still feels extremely dizzy and feels bad. She states she had a similar episode about 10 years ago but this feels worse. We'll try more IV fluids.  Patient had persistent orthostatic hypotension it at 0600. She was given 2 more liters of normal saline.  Recheck at 0900 patient still complained of abdominal pain she shows me she hurts in her left lower quadrant there is no guarding or rebound. She is finishing her second liter of normal saline. Telfa she will be able to be discharged, will recheck her orthostatic VS again.   Bimanual pelvic exam was done by Dr. Fonnie Jarvis however he did not get swabs. Patient has had treatment for BV this year. Will put back on those antibiotics.  Ct Abdomen Pelvis W Contrast  02/01/2012  *RADIOLOGY REPORT*  Clinical Data: Stomach pain; hypotension.  CT ABDOMEN AND PELVIS WITH CONTRAST  Technique:  Multidetector CT imaging of the abdomen and pelvis was performed following the standard protocol during bolus administration of intravenous contrast.  Contrast: OMNIPAQUE IOHEXOL 300 MG/ML  SOLN  Comparison: CT of the abdomen and pelvis performed 09/24/2011  Findings: Mild right basilar atelectasis is noted.  The liver and spleen are unremarkable in appearance.  The gallbladder is within normal limits.  The pancreas and adrenal glands are unremarkable.  The kidneys are unremarkable in appearance.  There is no evidence of hydronephrosis.  No renal or ureteral stones are seen.  No perinephric stranding is appreciated.  No free fluid is identified.  The small bowel is unremarkable in appearance.  The stomach is within normal limits.  No acute vascular abnormalities are seen.  The appendix is normal in caliber and contains contrast, without  evidence for appendicitis.  The colon is filled with contrast and is unremarkable in appearance.  The bladder is mildly distended and grossly unremarkable in appearance.  The uterus is within normal limits.  The ovaries are relatively symmetric; no suspicious adnexal masses are seen.  No inguinal lymphadenopathy is seen.  No acute osseous abnormalities are identified.  IMPRESSION:  1.  No acute abnormalities identified within the abdomen or pelvis. 2.  Mild right basilar atelectasis noted.   Original Report Authenticated By: Tonia Ghent, M.D.     Diagnoses that have been ruled out:  None  Diagnoses that are still under consideration:  None  Final diagnoses:  Abdominal pain  Orthostatic hypotension    New Prescriptions   METRONIDAZOLE (FLAGYL) 500 MG TABLET    Take 1 tablet (500 mg total) by mouth 2 (two) times daily.   ONDANSETRON (ZOFRAN) 4 MG TABLET    Take 1 tablet (4 mg total) by mouth every 6 (six) hours.   TRAMADOL-ACETAMINOPHEN (ULTRACET) 37.5-325 MG PER TABLET    2 tabs po QID prn pain    Plan discharge  Devoria Albe, MD, FACEP   .  Ward Givens, MD 02/01/12 [phone]

## 2012-02-01 NOTE — ED Notes (Signed)
Returned from CT.

## 2012-02-02 LAB — URINE CULTURE: Colony Count: 90000

## 2012-05-26 ENCOUNTER — Emergency Department (HOSPITAL_COMMUNITY)
Admission: EM | Admit: 2012-05-26 | Discharge: 2012-05-26 | Discharge status: Against Medical Advice | Payer: Medicaid Other | Source: Home / Self Care

## 2012-05-26 ENCOUNTER — Encounter (HOSPITAL_COMMUNITY): Payer: Self-pay | Admitting: Emergency Medicine

## 2012-05-26 ENCOUNTER — Encounter (HOSPITAL_COMMUNITY): Payer: Self-pay | Admitting: *Deleted

## 2012-05-26 ENCOUNTER — Inpatient Hospital Stay (HOSPITAL_COMMUNITY)
Admission: AD | Admit: 2012-05-26 | Discharge: 2012-05-26 | Disposition: A | Discharge status: Hospice | Payer: Medicaid Other | Source: Ambulatory Visit | Attending: Obstetrics and Gynecology | Admitting: Obstetrics and Gynecology

## 2012-05-26 DIAGNOSIS — N898 Other specified noninflammatory disorders of vagina: Secondary | ICD-10-CM | POA: Insufficient documentation

## 2012-05-26 DIAGNOSIS — N938 Other specified abnormal uterine and vaginal bleeding: Secondary | ICD-10-CM | POA: Insufficient documentation

## 2012-05-26 DIAGNOSIS — N390 Urinary tract infection, site not specified: Secondary | ICD-10-CM

## 2012-05-26 DIAGNOSIS — Z87891 Personal history of nicotine dependence: Secondary | ICD-10-CM | POA: Insufficient documentation

## 2012-05-26 DIAGNOSIS — R109 Unspecified abdominal pain: Secondary | ICD-10-CM | POA: Insufficient documentation

## 2012-05-26 DIAGNOSIS — M545 Low back pain, unspecified: Secondary | ICD-10-CM | POA: Insufficient documentation

## 2012-05-26 DIAGNOSIS — N949 Unspecified condition associated with female genital organs and menstrual cycle: Secondary | ICD-10-CM | POA: Insufficient documentation

## 2012-05-26 DIAGNOSIS — E039 Hypothyroidism, unspecified: Secondary | ICD-10-CM | POA: Insufficient documentation

## 2012-05-26 HISTORY — DX: Depression, unspecified: F32.A

## 2012-05-26 HISTORY — DX: Major depressive disorder, single episode, unspecified: F32.9

## 2012-05-26 LAB — COMPREHENSIVE METABOLIC PANEL
ALT: 12 U/L (ref 0–35)
AST: 18 U/L (ref 0–37)
Alkaline Phosphatase: 58 U/L (ref 39–117)
CO2: 29 mEq/L (ref 19–32)
Chloride: 106 mEq/L (ref 96–112)
Creatinine, Ser: 0.75 mg/dL (ref 0.50–1.10)
GFR calc non Af Amer: >90 mL/min (ref >90)
Total Bilirubin: 0.2 mg/dL — ABNORMAL LOW (ref 0.3–1.2)

## 2012-05-26 LAB — WET PREP, GENITAL
Trich, Wet Prep: NONE SEEN
Yeast Wet Prep HPF POC: NONE SEEN

## 2012-05-26 LAB — CBC WITH DIFFERENTIAL/PLATELET
Basophils Absolute: 0 10*3/uL (ref 0.0–0.1)
HCT: 40.2 % (ref 36.0–46.0)
Hemoglobin: 14.4 g/dL (ref 12.0–15.0)
Lymphocytes Relative: 37 % (ref 12–46)
Monocytes Absolute: 0.4 10*3/uL (ref 0.1–1.0)
Neutro Abs: 2.7 10*3/uL (ref 1.7–7.7)
RDW: 12.9 % (ref 11.5–15.5)
WBC: 5.2 10*3/uL (ref 4.0–10.5)

## 2012-05-26 LAB — URINALYSIS, ROUTINE W REFLEX MICROSCOPIC
Bilirubin Urine: NEGATIVE
Ketones, ur: 15 mg/dL — AB
Protein, ur: >300 mg/dL — AB
Urobilinogen, UA: 1 mg/dL (ref 0.0–1.0)

## 2012-05-26 LAB — URINE MICROSCOPIC-ADD ON

## 2012-05-26 MED ORDER — IBUPROFEN 800 MG PO TABS: 800.0000 mg | ORAL_TABLET | Freq: Three times a day (TID) | ORAL | Status: DC | PRN

## 2012-05-26 MED ORDER — MEGESTROL ACETATE 20 MG PO TABS: 20.0000 mg | ORAL_TABLET | Freq: Every day | ORAL | Status: DC

## 2012-05-26 MED ORDER — SULFAMETHOXAZOLE-TRIMETHOPRIM 800-160 MG PO TABS: 1.0000 | ORAL_TABLET | Freq: Two times a day (BID) | ORAL | Status: DC

## 2012-05-26 MED ORDER — IBUPROFEN 800 MG PO TABS
800.0000 mg | ORAL_TABLET | Freq: Once | ORAL | Status: AC
  Administered 2012-05-26: 800 mg via ORAL
  Filled 2012-05-26: qty 1

## 2012-05-26 NOTE — ED Notes (Signed)
Called the patient three times to reassess vitals and she did not answer.

## 2012-05-26 NOTE — MAU Provider Note (Signed)
First Provider Initiated Contact with Patient 05/26/12 2214      Chief Complaint:  Vaginal Bleeding and Abdominal Pain   [name] is  37 y.o. [id number].  Patient's last menstrual period was 05/16/2012.Marland Kitchen  Her pregnancy status is negative. She had a BTL 5 years ago.  She presents complaining of Vaginal Bleeding and Abdominal Pain Her abdominal pain is suprapubic, and also has pain at beginning and end of stream.  She had a normal period 4/11, and started bleeding again yesterday. Pt is hypothyroid, but isn't taking her Synthroid ("I'm horrible taking medications regularly").  Pt was seen at [facility] (the MD there ordered labs) and pt left because the wait was too long  Past Medical History  Diagnosis Date  . Thyroid disease   . ADD (attention deficit disorder)   . Bipolar 1 disorder   . Depression   . ADD (attention deficit disorder)     Past Surgical History  Procedure Laterality Date  . Mouth surgery    . Tubal ligation      Family History  Problem Relation Age of Onset  . Cancer Other   . Heart failure Other     History  Substance Use Topics  . Smoking status: Former Smoker    Quit date: 05/16/2011  . Smokeless tobacco: Never Used  . Alcohol Use: Yes     Comment: socially    Allergies:  Allergies  Allergen Reactions  . Phenergan (Promethazine Hcl) Other (See Comments)    Restless legs    Prescriptions prior to admission  Medication Sig Dispense Refill  . amphetamine-dextroamphetamine (ADDERALL) 20 MG tablet Take 20 mg by mouth daily.      Marland Kitchen levothyroxine (SYNTHROID, LEVOTHROID) 50 MCG tablet Take 50 mcg by mouth daily.      . [DISCONTINUED] citalopram (CELEXA) 20 MG tablet Take 20 mg by mouth daily.      . [DISCONTINUED] ondansetron (ZOFRAN) 4 MG tablet Take 1 tablet (4 mg total) by mouth every 6 (six) hours.  6 tablet  0  . [DISCONTINUED] traMADol-acetaminophen (ULTRACET) 37.5-325 MG per tablet 2 tabs po QID prn pain  20 tablet  0     Review of Systems   Review of Systems  Constitutional: Negative for fever, chills, weight loss, malaise/fatigue and diaphoresis.  HENT: Negative for hearing loss, ear pain, nosebleeds, congestion, sore throat, neck pain, tinnitus and ear discharge.   Eyes: Negative for blurred vision, double vision, photophobia, pain, discharge and redness.  Respiratory: Negative for cough, hemoptysis, sputum production, shortness of breath, wheezing and stridor.   Cardiovascular: Negative for chest pain, palpitations, orthopnea,  leg swelling  Gastrointestinal: Negative for abdominal pain heartburn, nausea, vomiting, diarrhea, constipation, blood in stool. POSITIVE for suprapubic pain Genitourinary:POSITIVE for dysuria, urgency, frequency, hematuria and flank pain.  Musculoskeletal: Negative for myalgias, back pain, joint pain and falls.  Skin: Negative for itching and rash.  Neurological: Negative for dizziness, tingling, tremors, sensory change, speech change, focal weakness, seizures, loss of consciousness, weakness and headaches.  Endo/Heme/Allergies: Negative for environmental allergies and polydipsia. Does not bruise/bleed easily.  Psychiatric/Behavioral: Negative for depression, suicidal ideas, hallucinations, memory loss and substance abuse. The patient is not nervous/anxious and does not have insomnia.      Physical Exam   Blood pressure 95/73, pulse 85, temperature 97.8 F (36.6 C), temperature source Oral, resp. rate 20, height 5\' 4"  (1.626 m), weight 85.276 kg (188 lb), last menstrual period 05/16/2012, SpO2 100.00%.  General: General appearance - oriented to person, place,  and time Chest - clear to auscultation, no wheezes, rales or rhonchi, symmetric air entry Heart - normal rate and regular rhythm Abdomen - tenderness noted suprapubic Pelvic - normal external genitalia, vulva, vagina, cervix, uterus and adnexa, small to mod amount of dark red blood.  coming from cervical os. No abnormal appearing discharge   Cervix non friable Extremities - no pedal edema noted   Labs: Results for orders placed during the hospital encounter of 05/26/12 (from the past 24 hour(s))  URINALYSIS, ROUTINE W REFLEX MICROSCOPIC   Collection Time    05/26/12  9:30 PM      Result Value Range   Color, Urine YELLOW  YELLOW   APPearance HAZY (*) CLEAR   Specific Gravity, Urine >1.030 (*) 1.005 - 1.030   pH 6.5  5.0 - 8.0   Glucose, UA NEGATIVE  NEGATIVE mg/dL   Hgb urine dipstick LARGE (*) NEGATIVE   Bilirubin Urine NEGATIVE  NEGATIVE   Ketones, ur 15 (*) NEGATIVE mg/dL   Protein, ur >161 (*) NEGATIVE mg/dL   Urobilinogen, UA 1.0  0.0 - 1.0 mg/dL   Nitrite POSITIVE (*) NEGATIVE   Leukocytes, UA TRACE (*) NEGATIVE  URINE MICROSCOPIC-ADD ON   Collection Time    05/26/12  9:30 PM      Result Value Range   Squamous Epithelial / LPF MANY (*) RARE   WBC, UA 3-6  <3 WBC/hpf   RBC / HPF TOO NUMEROUS TO COUNT  <3 RBC/hpf   Bacteria, UA MANY (*) RARE   Urine-Other MUCOUS PRESENT    POCT PREGNANCY, URINE   Collection Time    05/26/12  9:45 PM      Result Value Range   Preg Test, Ur NEGATIVE  NEGATIVE  WET PREP, GENITAL   Collection Time    05/26/12 10:20 PM      Result Value Range   Yeast Wet Prep HPF POC NONE SEEN  NONE SEEN   Trich, Wet Prep NONE SEEN  NONE SEEN   Clue Cells Wet Prep HPF POC FEW (*) NONE SEEN   WBC, Wet Prep HPF POC FEW (*) NONE SEEN  CBC WITH DIFFERENTIAL   Collection Time    05/26/12  8:30 PM      Result Value Range   WBC 5.2  4.0 - 10.5 K/uL   RBC 4.61  3.87 - 5.11 MIL/uL   Hemoglobin 14.4  12.0 - 15.0 g/dL   HCT 09.6  04.5 - 40.9 %   MCV 87.2  78.0 - 100.0 fL   MCH 31.2  26.0 - 34.0 pg   MCHC 35.8  30.0 - 36.0 g/dL   RDW 81.1  91.4 - 78.2 %   Platelets 228  150 - 400 K/uL   Neutrophils Relative 53  43 - 77 %   Neutro Abs 2.7  1.7 - 7.7 K/uL   Lymphocytes Relative 37  12 - 46 %   Lymphs Abs 1.9  0.7 - 4.0 K/uL   Monocytes Relative 7  3 - 12 %   Monocytes Absolute 0.4  0.1 - 1.0  K/uL   Eosinophils Relative 3  0 - 5 %   Eosinophils Absolute 0.1  0.0 - 0.7 K/uL   Basophils Relative 1  0 - 1 %   Basophils Absolute 0.0  0.0 - 0.1 K/uL  COMPREHENSIVE METABOLIC PANEL   Collection Time    05/26/12  8:30 PM      Result Value Range   Sodium 142  135 -  145 mEq/L   Potassium 4.3  3.5 - 5.1 mEq/L   Chloride 106  96 - 112 mEq/L   CO2 29  19 - 32 mEq/L   Glucose, Bld 93  70 - 99 mg/dL   BUN 10  6 - 23 mg/dL   Creatinine, Ser 4.09  0.50 - 1.10 mg/dL   Calcium 9.1  8.4 - 81.1 mg/dL   Total Protein 7.6  6.0 - 8.3 g/dL   Albumin 4.2  3.5 - 5.2 g/dL   AST 18  0 - 37 U/L   ALT 12  0 - 35 U/L   Alkaline Phosphatase 58  39 - 117 U/L   Total Bilirubin 0.2 (*) 0.3 - 1.2 mg/dL   GFR calc non Af Amer >90  >90 mL/min   GFR calc Af Amer >90  >90 mL/min   Imaging Studies:  No results found.   Assessment: UTI DUB, possibly secondary to untreated hypothyroidism Plan:  Treat UTI Megace algorhythm for bleeding;  Referral to GYN clinic if frequent bleeding becomes regular Encouraged to restart Synthroid  CRESENZO-DISHMAN,Shaquia Berkley

## 2012-05-26 NOTE — MAU Note (Signed)
Pt reports that since last pm she has had lower abd pain, back pain this pm. Vaginal bleeding started this pm.states her LMP 04/11.

## 2012-05-26 NOTE — MAU Note (Signed)
Pt ended her last cycle on April 11th.  Started bleeding last night with moderate bleeding through today; pain at beginning and end of stream.  Pt states this is the worst pain she has ever felt.  Pt took an AZO thinking it was a UTI.

## 2012-05-26 NOTE — ED Notes (Signed)
PT. REPORTS LOW ABDOMINAL PAIN / LOW BACK PAIN WITH VAGINAL BLEEDING ONSET LAST  NIGHT .

## 2012-05-27 LAB — GC/CHLAMYDIA PROBE AMP: GC Probe RNA: NEGATIVE

## 2012-05-28 LAB — URINE CULTURE

## 2012-05-29 NOTE — MAU Provider Note (Signed)
Attestation of Attending Supervision of Advanced Practitioner (CNM/NP): Evaluation and management procedures were performed by the Advanced Practitioner under my supervision and collaboration.  I have reviewed the Advanced Practitioner's note and chart, and I agree with the management and plan.  Neidra Girvan 05/29/2012 9:18 PM

## 2012-07-03 ENCOUNTER — Encounter: Payer: Medicaid Other | Admitting: Medical

## 2012-07-08 NOTE — MAU Note (Signed)
Chart audited for QI 

## 2012-08-15 ENCOUNTER — Emergency Department (HOSPITAL_COMMUNITY)
Admission: EM | Admit: 2012-08-15 | Discharge: 2012-08-15 | Disposition: A | Discharge status: Home / Self Care | Payer: Medicaid Other | Attending: Emergency Medicine | Admitting: Emergency Medicine

## 2012-08-15 ENCOUNTER — Emergency Department (HOSPITAL_COMMUNITY): Payer: Medicaid Other

## 2012-08-15 ENCOUNTER — Encounter (HOSPITAL_COMMUNITY): Payer: Self-pay | Admitting: Family Medicine

## 2012-08-15 DIAGNOSIS — R519 Headache, unspecified: Secondary | ICD-10-CM

## 2012-08-15 DIAGNOSIS — M7989 Other specified soft tissue disorders: Secondary | ICD-10-CM | POA: Insufficient documentation

## 2012-08-15 DIAGNOSIS — R5381 Other malaise: Secondary | ICD-10-CM | POA: Insufficient documentation

## 2012-08-15 DIAGNOSIS — Z79899 Other long term (current) drug therapy: Secondary | ICD-10-CM | POA: Insufficient documentation

## 2012-08-15 DIAGNOSIS — E039 Hypothyroidism, unspecified: Secondary | ICD-10-CM | POA: Insufficient documentation

## 2012-08-15 DIAGNOSIS — R5383 Other fatigue: Secondary | ICD-10-CM | POA: Insufficient documentation

## 2012-08-15 DIAGNOSIS — F17200 Nicotine dependence, unspecified, uncomplicated: Secondary | ICD-10-CM | POA: Insufficient documentation

## 2012-08-15 DIAGNOSIS — F988 Other specified behavioral and emotional disorders with onset usually occurring in childhood and adolescence: Secondary | ICD-10-CM | POA: Insufficient documentation

## 2012-08-15 DIAGNOSIS — F319 Bipolar disorder, unspecified: Secondary | ICD-10-CM | POA: Insufficient documentation

## 2012-08-15 DIAGNOSIS — R110 Nausea: Secondary | ICD-10-CM | POA: Insufficient documentation

## 2012-08-15 DIAGNOSIS — R51 Headache: Secondary | ICD-10-CM | POA: Insufficient documentation

## 2012-08-15 LAB — CBC WITH DIFFERENTIAL/PLATELET
Basophils Absolute: 0 10*3/uL (ref 0.0–0.1)
Eosinophils Relative: 1 % (ref 0–5)
HCT: 37.4 % (ref 36.0–46.0)
Hemoglobin: 12.9 g/dL (ref 12.0–15.0)
Lymphocytes Relative: 21 % (ref 12–46)
Lymphs Abs: 2.4 10*3/uL (ref 0.7–4.0)
MCV: 88.2 fL (ref 78.0–100.0)
Monocytes Absolute: 0.6 10*3/uL (ref 0.1–1.0)
Monocytes Relative: 5 % (ref 3–12)
RDW: 13.2 % (ref 11.5–15.5)
WBC: 11.5 10*3/uL — ABNORMAL HIGH (ref 4.0–10.5)

## 2012-08-15 LAB — URINALYSIS, ROUTINE W REFLEX MICROSCOPIC
Glucose, UA: NEGATIVE mg/dL
Hgb urine dipstick: NEGATIVE
Ketones, ur: 15 mg/dL — AB
Protein, ur: NEGATIVE mg/dL
pH: 6.5 (ref 5.0–8.0)

## 2012-08-15 LAB — BASIC METABOLIC PANEL
BUN: 10 mg/dL (ref 6–23)
CO2: 22 mEq/L (ref 19–32)
Calcium: 9.2 mg/dL (ref 8.4–10.5)
Creatinine, Ser: 0.65 mg/dL (ref 0.50–1.10)
Glucose, Bld: 87 mg/dL (ref 70–99)

## 2012-08-15 MED ORDER — DIPHENHYDRAMINE HCL 50 MG/ML IJ SOLN
25.0000 mg | Freq: Once | INTRAMUSCULAR | Status: AC
  Administered 2012-08-15: 25 mg via INTRAVENOUS
  Filled 2012-08-15: qty 1

## 2012-08-15 MED ORDER — SODIUM CHLORIDE 0.9 % IV BOLUS (SEPSIS)
500.0000 mL | Freq: Once | INTRAVENOUS | Status: AC
  Administered 2012-08-15: 500 mL via INTRAVENOUS

## 2012-08-15 MED ORDER — KETOROLAC TROMETHAMINE 30 MG/ML IJ SOLN
30.0000 mg | Freq: Once | INTRAMUSCULAR | Status: AC
  Administered 2012-08-15: 30 mg via INTRAVENOUS
  Filled 2012-08-15: qty 1

## 2012-08-15 MED ORDER — METOCLOPRAMIDE HCL 5 MG/ML IJ SOLN
10.0000 mg | Freq: Once | INTRAMUSCULAR | Status: AC
  Administered 2012-08-15: 10 mg via INTRAVENOUS
  Filled 2012-08-15: qty 2

## 2012-08-15 NOTE — ED Notes (Signed)
Patient states that she has head a headache since this morning. States that a few days prior day she was nauseated and had generalized ill feeling. States her head "feels like electricity is shooting through my head."  Was seen by PCP yesterday and was told that she has hypothyroid. States her symptoms have gotten worse. Reports pain and sensitivity to light.

## 2012-08-15 NOTE — ED Provider Notes (Signed)
History    CSN: [id number] Arrival date & time 08/15/12  2002  First MD Initiated Contact with Patient 08/15/12 2149     Chief Complaint  Patient presents with  . Headache   (Consider location/radiation/quality/duration/timing/severity/associated sxs/prior Treatment) The history is provided by the patient.  [name] is a 37 y.o. female hx of hypothyroidism, bipolar, ADD here with headache. Diffuse weakness for the last several days and nausea. Saw PMD yesterday was diagnosed with hypothyroidism. Today has shooting pain from the front of her head all the way down her spine that is intermittent. Denies neck pain or fever. No history of migraines but she was in the ER a year ago for similar symptoms. No brain imaging recently. Also noted to have some swelling in her hands when she has headaches.       Past Medical History  Diagnosis Date  . Thyroid disease   . ADD (attention deficit disorder)   . Bipolar 1 disorder   . Depression   . ADD (attention deficit disorder)    Past Surgical History  Procedure Laterality Date  . Mouth surgery    . Tubal ligation     Family History  Problem Relation Age of Onset  . Cancer Other   . Heart failure Other    History  Substance Use Topics  . Smoking status: Current Every Day Smoker -- 0.25 packs/day    Types: Cigarettes  . Smokeless tobacco: Never Used  . Alcohol Use: No   OB History   Grav Para Term Preterm Abortions TAB SAB Ect Mult Living   7 6 6  1  1   6      Review of Systems  Neurological: Positive for weakness and headaches.  All other systems reviewed and are negative.    Allergies  Phenergan  Home Medications   Current Outpatient Rx  Name  Route  Sig  Dispense  Refill  . levothyroxine (SYNTHROID, LEVOTHROID) 50 MCG tablet   Oral   Take 50 mcg by mouth every morning.          . lisdexamfetamine (VYVANSE) 20 MG capsule   Oral   Take 20 mg by mouth every morning.          LMP 07/07/2012 Physical  Exam  Nursing note and vitals reviewed. Constitutional: She is oriented to person, place, and time.  Crying, tearful. Uncomfortable   HENT:  Head: Normocephalic.  Mouth/Throat: Oropharynx is clear and moist.  Scalp tender to touch   Eyes: Conjunctivae and EOM are normal. Pupils are equal, round, and reactive to light.  Neck: Normal range of motion. Neck supple.  Cardiovascular: Normal rate, regular rhythm and normal heart sounds.   Pulmonary/Chest: Effort normal and breath sounds normal. No respiratory distress. She has no wheezes. She has no rales.  Abdominal: Soft. Bowel sounds are normal. She exhibits no distension. There is no tenderness. There is no rebound.  Musculoskeletal: Normal range of motion.  Neurological: She is alert and oriented to person, place, and time.  Nl strength and sensation throughout   Skin: Skin is warm and dry.  Psychiatric: She has a normal mood and affect. Her behavior is normal. Judgment and thought content normal.    ED Course  Procedures (including critical care time) Labs Reviewed  CBC WITH DIFFERENTIAL - Abnormal; Notable for the following:    WBC 11.5 (*)    Neutro Abs 8.3 (*)    All other components within normal limits  URINALYSIS, ROUTINE W REFLEX  MICROSCOPIC - Abnormal; Notable for the following:    APPearance HAZY (*)    Ketones, ur 15 (*)    All other components within normal limits  BASIC METABOLIC PANEL   Ct Head Wo Contrast  08/15/2012   *RADIOLOGY REPORT*  Clinical Data: Headaches.  No known injury.  CT HEAD WITHOUT CONTRAST  Technique:  Contiguous axial images were obtained from the base of the skull through the vertex without contrast.  Comparison: 01/31/2012  Findings: The ventricles and sulci are symmetrical without significant effacement, displacement, or dilatation. No mass effect or midline shift. No abnormal extra-axial fluid collections. The grey-white matter junction is distinct. Basal cisterns are not effaced. No acute  intracranial hemorrhage. No depressed skull fractures.  There is a retention cyst in the left maxillary antrum. Visualized paranasal sinuses and mastoid air cells are otherwise not opacified.  No significant change since previous studies.  IMPRESSION: No acute intracranial abnormalities.   Original Report Authenticated By: Burman Nieves, M.D.   1. Headache     MDM  [name] is a 37 y.o. female here with headaches. Likely migraines as she had them before. Will do CT head to r/o mass. Will get labs and give headache cocktail and reassess.   11:30 PM Headache improved. Eating in the ED. Labs unremarkable. CT head nl. Will have her f/u with a neurologist.    Richardean Canal, MD 08/15/12 2330

## 2012-08-15 NOTE — ED Notes (Signed)
Patient transported to CT 

## 2012-09-29 ENCOUNTER — Encounter (HOSPITAL_COMMUNITY): Payer: Self-pay | Admitting: *Deleted

## 2012-09-29 ENCOUNTER — Inpatient Hospital Stay (HOSPITAL_COMMUNITY): Payer: Medicaid Other

## 2012-09-29 ENCOUNTER — Inpatient Hospital Stay (HOSPITAL_COMMUNITY)
Admission: AD | Admit: 2012-09-29 | Discharge: 2012-09-29 | Disposition: A | Discharge status: Home / Self Care | Payer: Medicaid Other | Source: Ambulatory Visit | Attending: Obstetrics & Gynecology | Admitting: Obstetrics & Gynecology

## 2012-09-29 DIAGNOSIS — M545 Low back pain, unspecified: Secondary | ICD-10-CM | POA: Insufficient documentation

## 2012-09-29 DIAGNOSIS — M62830 Muscle spasm of back: Secondary | ICD-10-CM

## 2012-09-29 DIAGNOSIS — R110 Nausea: Secondary | ICD-10-CM | POA: Insufficient documentation

## 2012-09-29 DIAGNOSIS — R319 Hematuria, unspecified: Secondary | ICD-10-CM | POA: Insufficient documentation

## 2012-09-29 DIAGNOSIS — N390 Urinary tract infection, site not specified: Secondary | ICD-10-CM

## 2012-09-29 DIAGNOSIS — R109 Unspecified abdominal pain: Secondary | ICD-10-CM | POA: Insufficient documentation

## 2012-09-29 HISTORY — DX: Hypothyroidism, unspecified: E03.9

## 2012-09-29 HISTORY — DX: Urinary tract infection, site not specified: N39.0

## 2012-09-29 LAB — COMPREHENSIVE METABOLIC PANEL
ALT: 7 U/L (ref 0–35)
Alkaline Phosphatase: 56 U/L (ref 39–117)
BUN: 7 mg/dL (ref 6–23)
CO2: 22 mEq/L (ref 19–32)
GFR calc Af Amer: >90 mL/min (ref >90)
GFR calc non Af Amer: >90 mL/min (ref >90)
Glucose, Bld: 92 mg/dL (ref 70–99)
Potassium: 3.3 mEq/L — ABNORMAL LOW (ref 3.5–5.1)
Sodium: 133 mEq/L — ABNORMAL LOW (ref 135–145)
Total Bilirubin: 0.6 mg/dL (ref 0.3–1.2)
Total Protein: 6.2 g/dL (ref 6.0–8.3)

## 2012-09-29 LAB — URINALYSIS, ROUTINE W REFLEX MICROSCOPIC
Bilirubin Urine: NEGATIVE
Glucose, UA: NEGATIVE mg/dL
Ketones, ur: 15 mg/dL — AB
Specific Gravity, Urine: 1.015 (ref 1.005–1.030)
pH: 7 (ref 5.0–8.0)

## 2012-09-29 LAB — CBC WITH DIFFERENTIAL/PLATELET
Basophils Relative: 0 % (ref 0–1)
HCT: 36.7 % (ref 36.0–46.0)
Hemoglobin: 12.7 g/dL (ref 12.0–15.0)
Lymphs Abs: 1.8 10*3/uL (ref 0.7–4.0)
MCH: 30.6 pg (ref 26.0–34.0)
MCHC: 34.6 g/dL (ref 30.0–36.0)
Monocytes Absolute: 0.9 10*3/uL (ref 0.1–1.0)
Monocytes Relative: 9 % (ref 3–12)
Neutro Abs: 7.7 10*3/uL (ref 1.7–7.7)
Neutrophils Relative %: 74 % (ref 43–77)
RBC: 4.15 MIL/uL (ref 3.87–5.11)

## 2012-09-29 LAB — URINE MICROSCOPIC-ADD ON

## 2012-09-29 MED ORDER — HYDROMORPHONE HCL PF 1 MG/ML IJ SOLN
2.0000 mg | Freq: Once | INTRAMUSCULAR | Status: AC
  Administered 2012-09-29: 2 mg via INTRAVENOUS
  Filled 2012-09-29: qty 2

## 2012-09-29 MED ORDER — LACTATED RINGERS IV SOLN
INTRAVENOUS | Status: DC
  Administered 2012-09-29: 19:00:00 via INTRAVENOUS

## 2012-09-29 MED ORDER — CEPHALEXIN 500 MG PO CAPS: 500.0000 mg | ORAL_CAPSULE | Freq: Three times a day (TID) | ORAL | Status: DC

## 2012-09-29 MED ORDER — PHENAZOPYRIDINE HCL 100 MG PO TABS: 100.0000 mg | ORAL_TABLET | Freq: Three times a day (TID) | ORAL | Status: DC | PRN

## 2012-09-29 MED ORDER — CYCLOBENZAPRINE HCL 10 MG PO TABS: 10.0000 mg | ORAL_TABLET | Freq: Three times a day (TID) | ORAL | Status: DC | PRN

## 2012-09-29 MED ORDER — HYDROMORPHONE HCL PF 1 MG/ML IJ SOLN
1.0000 mg | Freq: Once | INTRAMUSCULAR | Status: AC
  Administered 2012-09-29: 1 mg via INTRAVENOUS
  Filled 2012-09-29: qty 1

## 2012-09-29 MED ORDER — SODIUM CHLORIDE 0.9 % IV BOLUS (SEPSIS)
1000.0000 mL | INTRAVENOUS | Status: DC
  Administered 2012-09-29: 1000 mL via INTRAVENOUS

## 2012-09-29 MED ORDER — TRAMADOL HCL 50 MG PO TABS: 50.0000 mg | ORAL_TABLET | Freq: Four times a day (QID) | ORAL | Status: DC | PRN

## 2012-09-29 MED ORDER — DIPHENHYDRAMINE HCL 25 MG PO CAPS
25.0000 mg | ORAL_CAPSULE | Freq: Once | ORAL | Status: AC
  Administered 2012-09-29: 25 mg via ORAL
  Filled 2012-09-29: qty 1

## 2012-09-29 MED ORDER — PHENAZOPYRIDINE HCL 100 MG PO TABS
200.0000 mg | ORAL_TABLET | Freq: Once | ORAL | Status: AC
  Administered 2012-09-29: 200 mg via ORAL
  Filled 2012-09-29: qty 2

## 2012-09-29 MED ORDER — DEXTROSE 5 % IV SOLN
1.0000 g | Freq: Once | INTRAVENOUS | Status: AC
  Administered 2012-09-29: 1 g via INTRAVENOUS
  Filled 2012-09-29: qty 10

## 2012-09-29 MED ORDER — KETOROLAC TROMETHAMINE 60 MG/2ML IM SOLN
60.0000 mg | Freq: Once | INTRAMUSCULAR | Status: AC
  Administered 2012-09-29: 60 mg via INTRAMUSCULAR
  Filled 2012-09-29: qty 2

## 2012-09-29 MED ORDER — LACTATED RINGERS IV BOLUS (SEPSIS)
500.0000 mL | Freq: Once | INTRAVENOUS | Status: DC
  Administered 2012-09-29: 500 mL via INTRAVENOUS

## 2012-09-29 MED ORDER — SODIUM CHLORIDE 0.9 % IV BOLUS (SEPSIS)
500.0000 mL | Freq: Once | INTRAVENOUS | Status: AC
  Administered 2012-09-29: 500 mL via INTRAVENOUS

## 2012-09-29 NOTE — MAU Provider Note (Signed)
History     CSN: [id number]  Arrival date and time: 09/29/12 1705   First Provider Initiated Contact with Patient 09/29/12 1807      Chief Complaint  Patient presents with  . Dysuria  . Flank Pain   HPI  [name] is a 37 y.o.female who presents with right upper, mid, and lower abdominal pain along with severe right lower back pain that started around 0300. The pain progressively got worse throughout the night; with symptoms including fever, chills and body aches.  She is having difficulty urinating; feels like she has to strain to urinate. Denies history of renal problems; denies history of kidney stone.   Past Medical History  Diagnosis Date  . Thyroid disease   . ADD (attention deficit disorder)   . Bipolar 1 disorder   . Depression   . ADD (attention deficit disorder)   . Hypothyroid   . Urinary tract infection     Past Surgical History  Procedure Laterality Date  . Mouth surgery    . Tubal ligation    . Vaginal delivery      X 6    Family History  Problem Relation Age of Onset  . Cancer Other   . Heart failure Other     History  Substance Use Topics  . Smoking status: Current Every Day Smoker -- 0.25 packs/day    Types: Cigarettes  . Smokeless tobacco: Never Used  . Alcohol Use: No    Allergies:  Allergies  Allergen Reactions  . Phenergan [Promethazine Hcl] Other (See Comments)    Restless legs    Prescriptions prior to admission  Medication Sig Dispense Refill  . levothyroxine (SYNTHROID, LEVOTHROID) 50 MCG tablet Take 50 mcg by mouth every morning.       . lisdexamfetamine (VYVANSE) 20 MG capsule Take 20 mg by mouth every morning.       Results for orders placed during the hospital encounter of 09/29/12 (from the past 24 hour(s))  URINALYSIS, ROUTINE W REFLEX MICROSCOPIC     Status: Abnormal   Collection Time    09/29/12  5:55 PM      Result Value Range   Color, Urine YELLOW  YELLOW   APPearance CLEAR  CLEAR   Specific Gravity,  Urine 1.015  1.005 - 1.030   pH 7.0  5.0 - 8.0   Glucose, UA NEGATIVE  NEGATIVE mg/dL   Hgb urine dipstick MODERATE (*) NEGATIVE   Bilirubin Urine NEGATIVE  NEGATIVE   Ketones, ur 15 (*) NEGATIVE mg/dL   Protein, ur NEGATIVE  NEGATIVE mg/dL   Urobilinogen, UA 2.0 (*) 0.0 - 1.0 mg/dL   Nitrite NEGATIVE  NEGATIVE   Leukocytes, UA MODERATE (*) NEGATIVE  URINE MICROSCOPIC-ADD ON     Status: Abnormal   Collection Time    09/29/12  5:55 PM      Result Value Range   Squamous Epithelial / LPF FEW (*) RARE   WBC, UA 21-50  <3 WBC/hpf   RBC / HPF 7-10  <3 RBC/hpf   Bacteria, UA MANY (*) RARE   Urine-Other MUCOUS PRESENT    POCT PREGNANCY, URINE     Status: None   Collection Time    09/29/12  5:59 PM      Result Value Range   Preg Test, Ur NEGATIVE  NEGATIVE  CBC WITH DIFFERENTIAL     Status: None   Collection Time    09/29/12  6:18 PM      Result Value Range  WBC 10.4  4.0 - 10.5 K/uL   RBC 4.15  3.87 - 5.11 MIL/uL   Hemoglobin 12.7  12.0 - 15.0 g/dL   HCT 16.1  09.6 - 04.5 %   MCV 88.4  78.0 - 100.0 fL   MCH 30.6  26.0 - 34.0 pg   MCHC 34.6  30.0 - 36.0 g/dL   RDW 40.9  81.1 - 91.4 %   Platelets 186  150 - 400 K/uL   Neutrophils Relative % 74  43 - 77 %   Neutro Abs 7.7  1.7 - 7.7 K/uL   Lymphocytes Relative 17  12 - 46 %   Lymphs Abs 1.8  0.7 - 4.0 K/uL   Monocytes Relative 9  3 - 12 %   Monocytes Absolute 0.9  0.1 - 1.0 K/uL   Eosinophils Relative 0  0 - 5 %   Eosinophils Absolute 0.0  0.0 - 0.7 K/uL   Basophils Relative 0  0 - 1 %   Basophils Absolute 0.0  0.0 - 0.1 K/uL  COMPREHENSIVE METABOLIC PANEL     Status: Abnormal   Collection Time    09/29/12  6:46 PM      Result Value Range   Sodium 133 (*) 135 - 145 mEq/L   Potassium 3.3 (*) 3.5 - 5.1 mEq/L   Chloride 98  96 - 112 mEq/L   CO2 22  19 - 32 mEq/L   Glucose, Bld 92  70 - 99 mg/dL   BUN 7  6 - 23 mg/dL   Creatinine, Ser 7.82  0.50 - 1.10 mg/dL   Calcium 8.5  8.4 - 95.6 mg/dL   Total Protein 6.2  6.0 - 8.3  g/dL   Albumin 3.3 (*) 3.5 - 5.2 g/dL   AST 9  0 - 37 U/L   ALT 7  0 - 35 U/L   Alkaline Phosphatase 56  39 - 117 U/L   Total Bilirubin 0.6  0.3 - 1.2 mg/dL   GFR calc non Af Amer >90  >90 mL/min   GFR calc Af Amer >90  >90 mL/min    Review of Systems  Constitutional: Positive for fever, chills and diaphoresis.  Gastrointestinal: Positive for nausea and abdominal pain. Negative for diarrhea and constipation.  Genitourinary: Positive for dysuria, urgency and flank pain.       Right upper, mid and lower abdominal pain Right flank pain   Neurological: Positive for weakness.  Psychiatric/Behavioral: The patient is nervous/anxious.    Physical Exam   Blood pressure 101/54, pulse 113, temperature 100.4 F (38 C), temperature source Oral, resp. rate 18, height 5\' 4"  (1.626 m), weight 84.823 kg (187 lb), last menstrual period 09/15/2012.  Physical Exam  Constitutional: She appears well-developed and well-nourished. She appears distressed.  Neck: Neck supple.  GI: She exhibits no distension. There is tenderness. There is guarding. There is no rebound.  Skin: She is diaphoretic.    MAU Course  Procedures  MDM Consulted with Dr. Erin Fulling at 1900: plan discussed  Renal Ultrasound 1 gram Rocephin  0.9 NS bolus  Pain management: 1 mg of dilaudid IVP at 1815; 2mg  of dilaudid IVP at 1925 60 mg Toradol IM X 1 at 1845 Consulted Dr. Erin Fulling at 1945: plan and pain management discussed  Foley Catheter placed   Assessment and Plan  A:   P:  Report given to M. Kahli Mayon CNM who resumed care of the patient  [name] 09/29/2012, 8:01 PM  US Renal  09/29/2012   *RADIOLOGY REPORT*  Clinical Data:  Right lower back pain, hematuria.  RENAL/URINARY TRACT ULTRASOUND COMPLETE  Comparison:  None.  Findings:  Right Kidney:  Measures 11.0 cm in length. Normal in size and parenchymal echogenicity.  No evidence of mass or hydronephrosis.  Left Kidney:  Measures 10.3 cm  in length. Normal in size and parenchymal echogenicity.  No evidence of mass or hydronephrosis.  Bladder:  Decompressed secondary to a Foley catheter.  IMPRESSION: No definite renal abnormality seen.   Original Report Authenticated By: Lupita Raider.,  M.D.   States pain med has worn off, though tech said she did well during scan I will give her some more Dilaudid and add Pyridium. Consulted Dr Erin Fulling who will come down to consult.>> Back pain seems to be more consistent with spasm. No CVA tenderness noted  Per Dr Erin Fulling, will discharge home on antibiotic, pain med, and Flexeril.    Medication List         ADIPEX-P PO  Take by mouth.     cephALEXin 500 MG capsule  Commonly known as:  KEFLEX  Take 1 capsule (500 mg total) by mouth 3 (three) times daily.     cyclobenzaprine 10 MG tablet  Commonly known as:  FLEXERIL  Take 1 tablet (10 mg total) by mouth 3 (three) times daily as needed for muscle spasms.     levothyroxine 50 MCG tablet  Commonly known as:  SYNTHROID, LEVOTHROID  Take 50 mcg by mouth every morning.     lisdexamfetamine 20 MG capsule  Commonly known as:  VYVANSE  Take 20 mg by mouth every morning.     phenazopyridine 100 MG tablet  Commonly known as:  PYRIDIUM  Take 1 tablet (100 mg total) by mouth 3 (three) times daily as needed for pain.     traMADol 50 MG tablet  Commonly known as:  ULTRAM  Take 1 tablet (50 mg total) by mouth every 6 (six) hours as needed for pain.         Wynelle Bourgeois CNM 09/29/12, 2047 hrs

## 2012-09-29 NOTE — MAU Note (Signed)
Pain in low, mid and upper right side pain and around to R flank. Started around 0330. Keep getting worse. C/O chills and body aches. States she has to strain to void and it is painful. States urine smells strong. C/O nausea, no vomiting. + CVA tenderness. No rebound tenderness. + guarding.

## 2012-10-01 LAB — URINE CULTURE: Colony Count: >=100000

## 2012-10-01 NOTE — MAU Provider Note (Signed)
Attestation of Attending Supervision of Advanced Practitioner (CNM/NP): Evaluation and management procedures were performed by the Advanced Practitioner under my supervision and collaboration. I personally examined the patient and reviewed her history. I have reviewed the Advanced Practitioner's note and chart, and I agree with the management and plan.  HARRAWAY-SMITH, Judine Arciniega 10:41 AM

## 2013-02-10 ENCOUNTER — Encounter (HOSPITAL_COMMUNITY): Payer: Self-pay | Admitting: Emergency Medicine

## 2013-02-10 ENCOUNTER — Emergency Department (HOSPITAL_COMMUNITY): Payer: Medicaid Other

## 2013-02-10 ENCOUNTER — Emergency Department (HOSPITAL_COMMUNITY)
Admission: EM | Admit: 2013-02-10 | Discharge: 2013-02-10 | Disposition: A | Discharge status: Home / Self Care | Payer: Medicaid Other | Attending: Emergency Medicine | Admitting: Emergency Medicine

## 2013-02-10 DIAGNOSIS — Z87440 Personal history of urinary (tract) infections: Secondary | ICD-10-CM | POA: Insufficient documentation

## 2013-02-10 DIAGNOSIS — F17200 Nicotine dependence, unspecified, uncomplicated: Secondary | ICD-10-CM | POA: Insufficient documentation

## 2013-02-10 DIAGNOSIS — R110 Nausea: Secondary | ICD-10-CM | POA: Insufficient documentation

## 2013-02-10 DIAGNOSIS — F319 Bipolar disorder, unspecified: Secondary | ICD-10-CM | POA: Insufficient documentation

## 2013-02-10 DIAGNOSIS — R197 Diarrhea, unspecified: Secondary | ICD-10-CM | POA: Insufficient documentation

## 2013-02-10 DIAGNOSIS — F988 Other specified behavioral and emotional disorders with onset usually occurring in childhood and adolescence: Secondary | ICD-10-CM | POA: Insufficient documentation

## 2013-02-10 DIAGNOSIS — Z888 Allergy status to other drugs, medicaments and biological substances status: Secondary | ICD-10-CM | POA: Insufficient documentation

## 2013-02-10 DIAGNOSIS — Z3202 Encounter for pregnancy test, result negative: Secondary | ICD-10-CM | POA: Insufficient documentation

## 2013-02-10 DIAGNOSIS — Z79899 Other long term (current) drug therapy: Secondary | ICD-10-CM | POA: Insufficient documentation

## 2013-02-10 DIAGNOSIS — Z91040 Latex allergy status: Secondary | ICD-10-CM | POA: Insufficient documentation

## 2013-02-10 DIAGNOSIS — R109 Unspecified abdominal pain: Secondary | ICD-10-CM | POA: Insufficient documentation

## 2013-02-10 DIAGNOSIS — E039 Hypothyroidism, unspecified: Secondary | ICD-10-CM | POA: Insufficient documentation

## 2013-02-10 DIAGNOSIS — R079 Chest pain, unspecified: Secondary | ICD-10-CM | POA: Insufficient documentation

## 2013-02-10 LAB — CBC WITH DIFFERENTIAL/PLATELET
BASOS ABS: 0 10*3/uL (ref 0.0–0.1)
BASOS PCT: 0 % (ref 0–1)
EOS PCT: 2 % (ref 0–5)
Eosinophils Absolute: 0.2 10*3/uL (ref 0.0–0.7)
HEMATOCRIT: 39.5 % (ref 36.0–46.0)
Hemoglobin: 13.5 g/dL (ref 12.0–15.0)
Lymphocytes Relative: 29 % (ref 12–46)
Lymphs Abs: 2.4 10*3/uL (ref 0.7–4.0)
MCH: 31 pg (ref 26.0–34.0)
MCHC: 34.2 g/dL (ref 30.0–36.0)
MCV: 90.6 fL (ref 78.0–100.0)
MONO ABS: 0.4 10*3/uL (ref 0.1–1.0)
Monocytes Relative: 5 % (ref 3–12)
Neutro Abs: 5.5 10*3/uL (ref 1.7–7.7)
Neutrophils Relative %: 65 % (ref 43–77)
Platelets: 212 10*3/uL (ref 150–400)
RBC: 4.36 MIL/uL (ref 3.87–5.11)
RDW: 13.1 % (ref 11.5–15.5)
WBC: 8.4 10*3/uL (ref 4.0–10.5)

## 2013-02-10 LAB — URINALYSIS, ROUTINE W REFLEX MICROSCOPIC
Bilirubin Urine: NEGATIVE
GLUCOSE, UA: NEGATIVE mg/dL
Hgb urine dipstick: NEGATIVE
Ketones, ur: NEGATIVE mg/dL
NITRITE: NEGATIVE
PH: 6.5 (ref 5.0–8.0)
PROTEIN: NEGATIVE mg/dL
Specific Gravity, Urine: 1.02 (ref 1.005–1.030)
Urobilinogen, UA: 0.2 mg/dL (ref 0.0–1.0)

## 2013-02-10 LAB — COMPREHENSIVE METABOLIC PANEL
ALBUMIN: 3.5 g/dL (ref 3.5–5.2)
ALT: 10 U/L (ref 0–35)
AST: 11 U/L (ref 0–37)
Alkaline Phosphatase: 46 U/L (ref 39–117)
BUN: 15 mg/dL (ref 6–23)
CALCIUM: 8.6 mg/dL (ref 8.4–10.5)
CO2: 21 mEq/L (ref 19–32)
CREATININE: 0.73 mg/dL (ref 0.50–1.10)
Chloride: 103 mEq/L (ref 96–112)
GFR calc Af Amer: >90 mL/min (ref >90)
Glucose, Bld: 113 mg/dL — ABNORMAL HIGH (ref 70–99)
Potassium: 3.5 mEq/L — ABNORMAL LOW (ref 3.7–5.3)
Sodium: 140 mEq/L (ref 137–147)
Total Bilirubin: 0.3 mg/dL (ref 0.3–1.2)
Total Protein: 6.7 g/dL (ref 6.0–8.3)

## 2013-02-10 LAB — POCT I-STAT TROPONIN I: TROPONIN I, POC: 0 ng/mL (ref 0.00–0.08)

## 2013-02-10 LAB — URINE MICROSCOPIC-ADD ON

## 2013-02-10 LAB — POCT PREGNANCY, URINE: Preg Test, Ur: NEGATIVE

## 2013-02-10 LAB — LIPASE, BLOOD: Lipase: 26 U/L (ref 11–59)

## 2013-02-10 MED ORDER — ONDANSETRON HCL 4 MG/2ML IJ SOLN
4.0000 mg | Freq: Once | INTRAMUSCULAR | Status: AC
  Administered 2013-02-10: 4 mg via INTRAVENOUS
  Filled 2013-02-10: qty 2

## 2013-02-10 MED ORDER — MORPHINE SULFATE 4 MG/ML IJ SOLN
4.0000 mg | Freq: Once | INTRAMUSCULAR | Status: AC
  Administered 2013-02-10: 4 mg via INTRAVENOUS
  Filled 2013-02-10: qty 1

## 2013-02-10 MED ORDER — SODIUM CHLORIDE 0.9 % IV BOLUS (SEPSIS)
1000.0000 mL | Freq: Once | INTRAVENOUS | Status: AC
  Administered 2013-02-10: 1000 mL via INTRAVENOUS

## 2013-02-10 MED ORDER — HYDROMORPHONE HCL PF 1 MG/ML IJ SOLN
1.0000 mg | Freq: Once | INTRAMUSCULAR | Status: AC
  Administered 2013-02-10: 1 mg via INTRAVENOUS
  Filled 2013-02-10: qty 1

## 2013-02-10 NOTE — ED Provider Notes (Signed)
CSN: [id number]     Arrival date & time 02/10/13  0813 History   First MD Initiated Contact with Patient 02/10/13 [phone]     Chief Complaint  Patient presents with  . Abdominal Pain  . Chest Pain  . Diarrhea   (Consider location/radiation/quality/duration/timing/severity/associated sxs/prior Treatment) HPI Comments: Patient is a 38 year old female with a past medical history of thyroid disease, depression, ADD, bipolar disorder who presents with abdominal pain. Symptoms started suddenly and progressively worsened since the onset. The pain is located in her epigastrium and radiates to her chest. The pain is described as sharp and severe. . No alleviating/aggravating factors. The patient has tried nothing for symptoms without relief. Associated symptoms include diarrhea and nausea. Patient denies fever, headache, vomiting, chest pain, SOB, dysuria, constipation.   Past Medical History  Diagnosis Date  . Thyroid disease   . ADD (attention deficit disorder)   . Bipolar 1 disorder   . Depression   . ADD (attention deficit disorder)   . Hypothyroid   . Urinary tract infection    Past Surgical History  Procedure Laterality Date  . Mouth surgery    . Tubal ligation    . Vaginal delivery      X 6   Family History  Problem Relation Age of Onset  . Cancer Other   . Heart failure Other    History  Substance Use Topics  . Smoking status: Current Every Day Smoker -- 0.25 packs/day    Types: Cigarettes  . Smokeless tobacco: Never Used  . Alcohol Use: No   OB History   Grav Para Term Preterm Abortions TAB SAB Ect Mult Living   7 6 6  1  1   6      Review of Systems  Constitutional: Negative for fever, chills and fatigue.  HENT: Negative for trouble swallowing.   Eyes: Negative for visual disturbance.  Respiratory: Negative for shortness of breath.   Cardiovascular: Positive for chest pain. Negative for palpitations.  Gastrointestinal: Positive for nausea, vomiting, abdominal pain and  diarrhea.  Genitourinary: Negative for dysuria and difficulty urinating.  Musculoskeletal: Negative for arthralgias and neck pain.  Skin: Negative for color change.  Neurological: Negative for dizziness and weakness.  Psychiatric/Behavioral: Negative for dysphoric mood.    Allergies  Latex and Phenergan  Home Medications   Current Outpatient Rx  Name  Route  Sig  Dispense  Refill  . amphetamine-dextroamphetamine (ADDERALL) 20 MG tablet   Oral   Take 20 mg by mouth daily.         . furosemide (LASIX) 20 MG tablet   Oral   Take 20 mg by mouth daily as needed for fluid.         Marland Kitchen levothyroxine (SYNTHROID, LEVOTHROID) 50 MCG tablet   Oral   Take 50 mcg by mouth every morning.           BP 98/78  Pulse 67  Temp(Src) 98.1 F (36.7 C) (Oral)  Resp 17  SpO2 100%  LMP 02/06/2013 Physical Exam  Nursing note and vitals reviewed. Constitutional: She is oriented to person, place, and time. She appears well-developed and well-nourished. No distress.  HENT:  Head: Normocephalic and atraumatic.  Eyes: Conjunctivae and EOM are normal.  Neck: Normal range of motion.  Cardiovascular: Normal rate and regular rhythm.  Exam reveals no gallop and no friction rub.   No murmur heard. Pulmonary/Chest: Effort normal and breath sounds normal. She has no wheezes. She has no rales. She  exhibits no tenderness.  Abdominal: Soft. She exhibits no distension. There is tenderness. There is no rebound and no guarding.  Epigastric and RUQ tenderness to palpation. No other focal tenderness to palpation or peritoneal signs.   Musculoskeletal: Normal range of motion.  Neurological: She is alert and oriented to person, place, and time. Coordination normal.  Speech is goal-oriented. Moves limbs without ataxia.   Skin: Skin is warm and dry.  Psychiatric: She has a normal mood and affect. Her behavior is normal.    ED Course  Procedures (including critical care time) Labs Review Labs Reviewed   COMPREHENSIVE METABOLIC PANEL - Abnormal; Notable for the following:    Potassium 3.5 (*)    Glucose, Bld 113 (*)    All other components within normal limits  URINALYSIS, ROUTINE W REFLEX MICROSCOPIC - Abnormal; Notable for the following:    Color, Urine AMBER (*)    APPearance CLOUDY (*)    Leukocytes, UA MODERATE (*)    All other components within normal limits  URINE MICROSCOPIC-ADD ON - Abnormal; Notable for the following:    Squamous Epithelial / LPF FEW (*)    All other components within normal limits  CBC WITH DIFFERENTIAL  LIPASE, BLOOD  POCT I-STAT TROPONIN I  POCT PREGNANCY, URINE   Imaging Review Dg Chest 2 View  02/10/2013   CLINICAL DATA:  38 year old female central and left side chest pain. Initial encounter.  EXAM: CHEST  2 VIEW  COMPARISON:  06/27/2011 and earlier.  FINDINGS: Stable and normal lung volumes. Normal cardiac size and mediastinal contours. Visualized tracheal air column is within normal limits. Lung parenchyma stable and clear. No pneumothorax or pleural effusion. *INSERT* negative bones  IMPRESSION: Negative, no acute cardiopulmonary abnormality.   Electronically Signed   By: Augusto GambleLee  Hall M.D.   On: 02/10/2013 09:39    EKG Interpretation    Date/Time:  Tuesday February 10 2013 08:20:57 EST Ventricular Rate:  68 PR Interval:  146 QRS Duration: 74 QT Interval:  414 QTC Calculation: 440 R Axis:   74 Text Interpretation:  Normal sinus rhythm Normal ECG since last tracing no significant change Confirmed by WENTZ  MD, ELLIOTT ([id number]) on 02/10/2013 12:09:47 PM            MDM   1. Abdominal pain   2. Chest pain     10:18 AM Labs and chest xray unremarkable. Vitals stable and patient afebrile. EKG unremarkable for acute changes and troponin negative. Patient given morphine as needed for pain. Patient is PERC negative.   12:06 PM Patient's labs unremarkable for acute changes. Vitals stable and patient afebrile. Patient is talking on the phone and  asked me to "wait a minute" while she finished her phone conversation. Patient appears to be feeling better. I doubt any life threatening etiology of her symptoms.    Emilia BeckKaitlyn Yuta Cipollone, PA-C 02/10/13 1527

## 2013-02-10 NOTE — ED Notes (Signed)
Pt reports she can't provide a urine sample right now.

## 2013-02-10 NOTE — Discharge Instructions (Signed)
Take ibuprofen or tylenol at home for your pain. Follow up with your primary care doctor. Refer to attached documents for more information.

## 2013-02-10 NOTE — ED Notes (Signed)
Pt informed so far bld work looks good, sts "I am going to be so mad if nothing is wrong, yall are going to have to send me to another hospital if there isn't anything showing wrong with me".

## 2013-02-10 NOTE — ED Notes (Addendum)
Pt reports all of a sudden she had a horrible cp and abd at the same time, then she started sweating and felt like she was going to pass out, happened at 0730 this morning. Prior to this she had been making breakfast for the kids. Pt reports the pain has been constant. Nad, skin warm and dry, resp e/u.

## 2013-02-10 NOTE — ED Notes (Signed)
Pt refused to have her temperature taken before leaving. Pt upset she doesn't know what is wrong with her and that she almost died this morning and she isn't being admitted to the hospital. Pt informed that her bld work and CXR all came back normal today, no immediate emergency medical problem was seen, if the symptoms worsen she may return to ED. Best thing to do at this moment is to follow up with her PCP for continuing care. Pt sts she doesn't have time to make all these appointments.

## 2013-02-10 NOTE — ED Provider Notes (Signed)
Medical screening examination/treatment/procedure(s) were performed by non-physician practitioner and as supervising physician I was immediately available for consultation/collaboration.  Collyn Ribas L Lancelot Alyea, MD 02/10/13 1737 

## 2013-02-10 NOTE — ED Notes (Signed)
Pt ambulated to restroom. 

## 2013-02-10 NOTE — ED Notes (Signed)
Pt states all the sudden at 0700 started having chest pain, abdominal pain, and diarrhea.  EMS reports copious diarrhea.  No vomiting.  PT in 10/10 Pain

## 2013-05-20 IMAGING — CT CT HEAD W/O CM
2 series · 17 of 30 positions shown, 20 images · non-contrast
Comparison: 06/18/2009

CLINICAL DATA: 36-year-old female with altered mental status.

CT HEAD WITHOUT CONTRAST
TECHNIQUE: Contiguous axial images were obtained from the base of
the skull through the vertex without contrast.

[Series 2: bone windows · axial · 0.43mm/px · z∈[-218,-98]mm · 8 of 52 slices shown]
[im 6/52  bone]
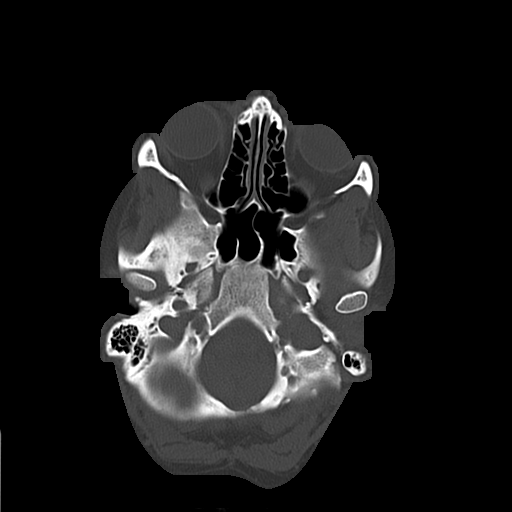
[im 12/52  bone]
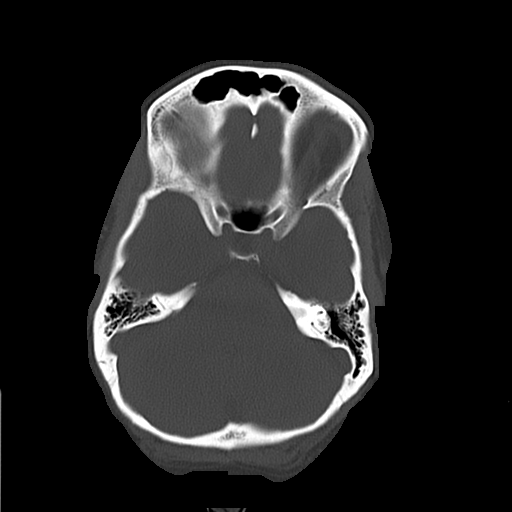
[im 18/52  bone]
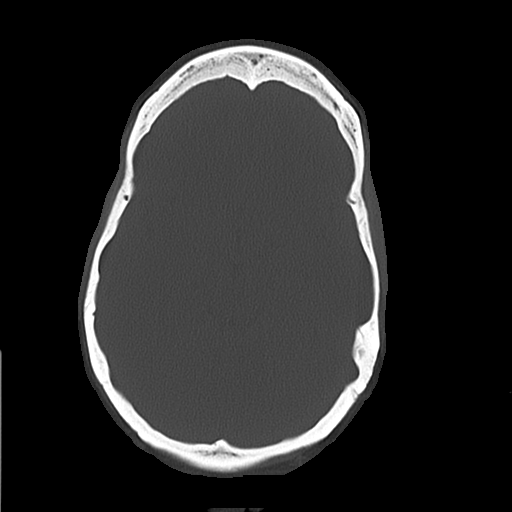
[im 23/52  bone]
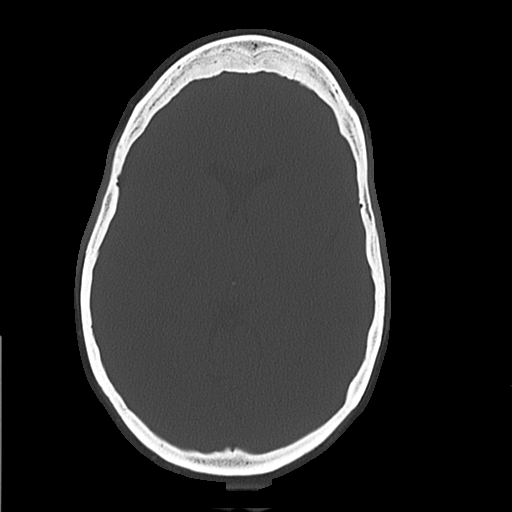
[im 29/52  bone]
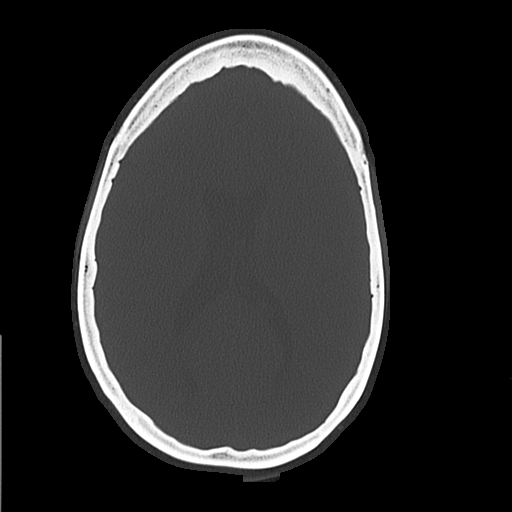
[im 35/52  bone]
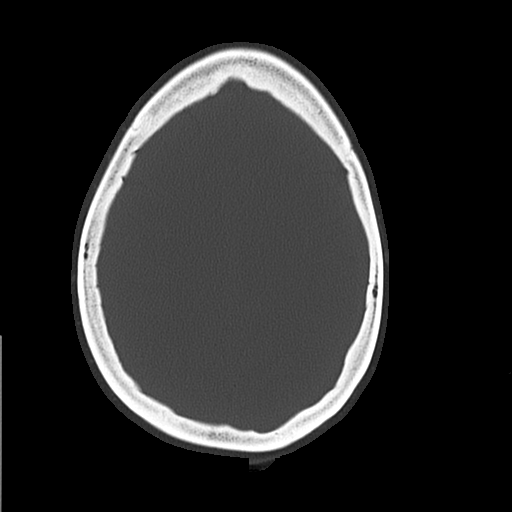
[im 40/52  bone]
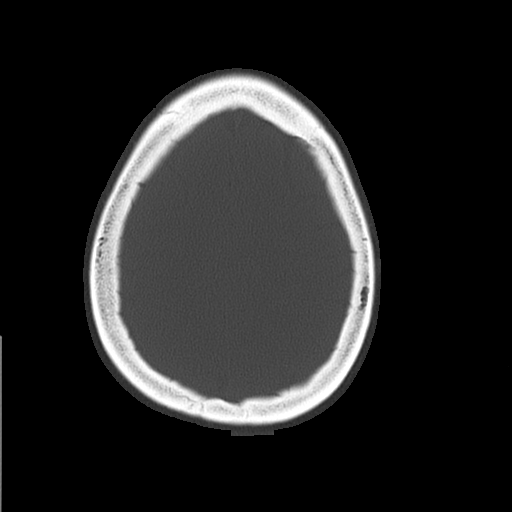
[im 46/52  bone]
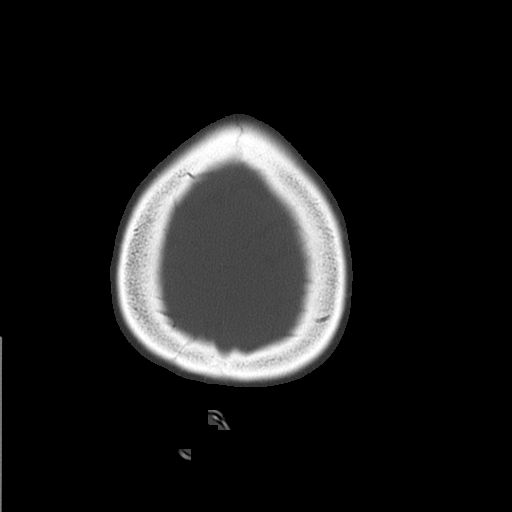

[Series 3: head w/o · axial · non-contrast · 0.43mm/px · z∈[-218,-98]mm · 9 of 31 slices shown, 12 images]
[im 4/31  brain]
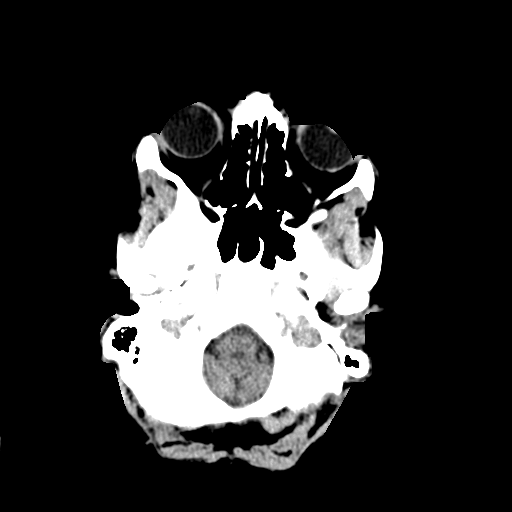
[im 4/31  bone]
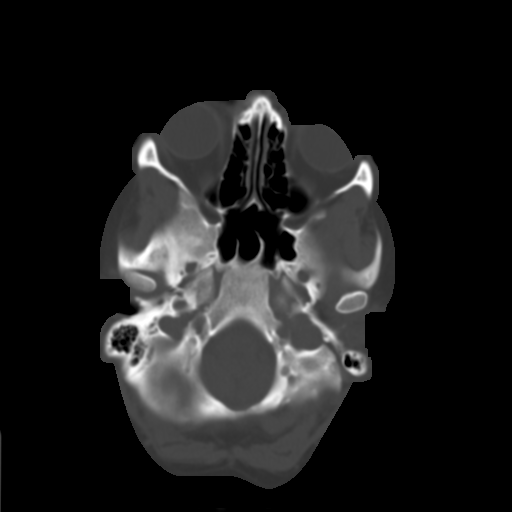
[im 7/31  brain]
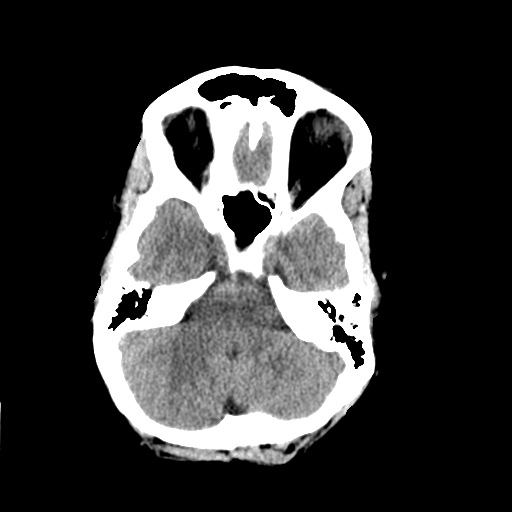
[im 10/31  brain]
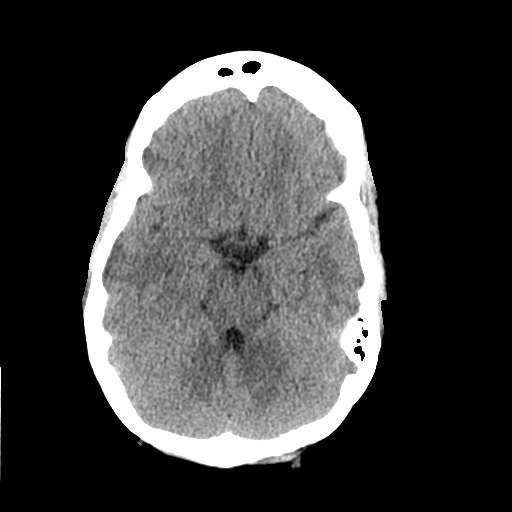
[im 13/31  brain]
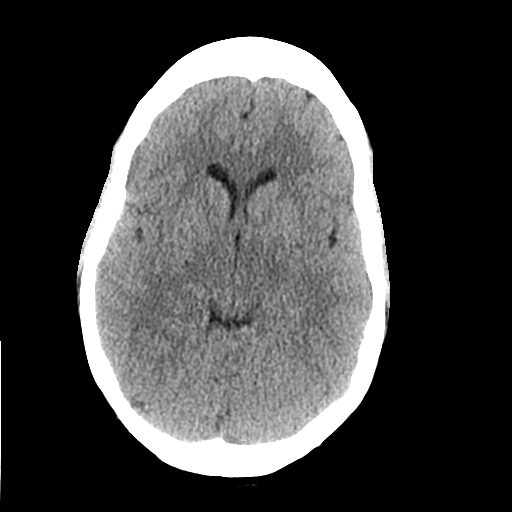
[im 16/31  brain]
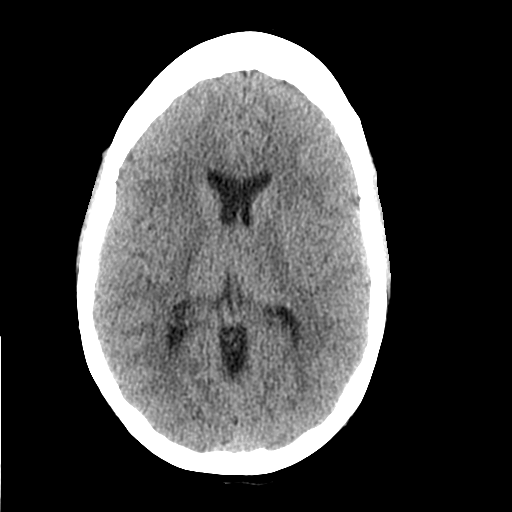
[im 16/31  bone]
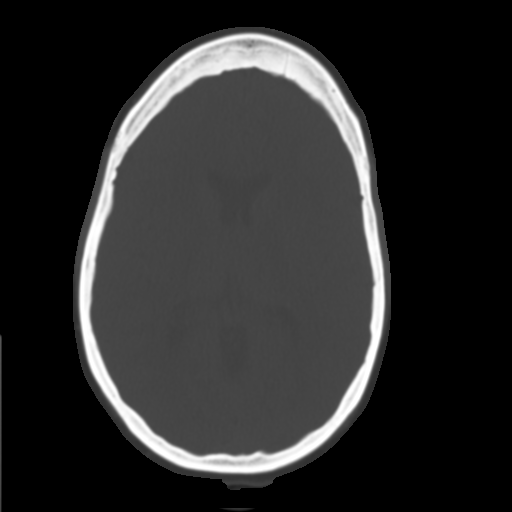
[im 19/31  brain]
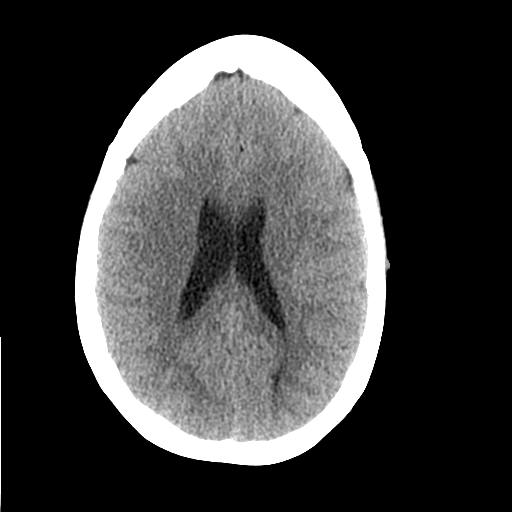
[im 22/31  brain]
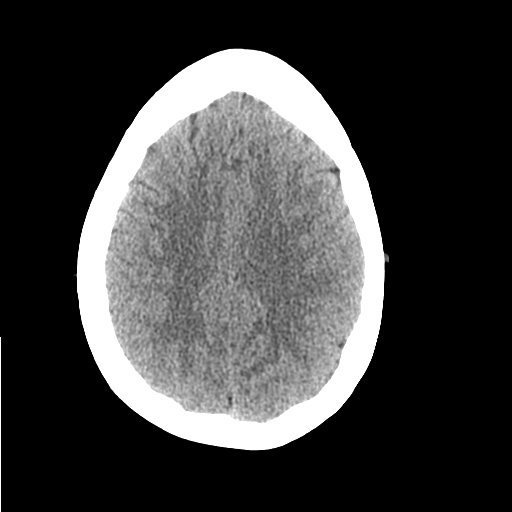
[im 25/31  brain]
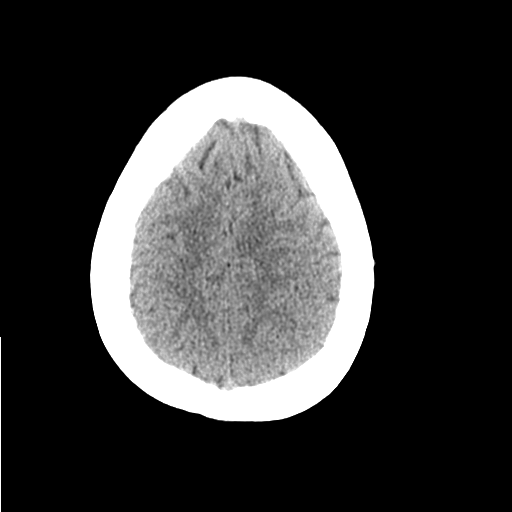
[im 28/31  brain]
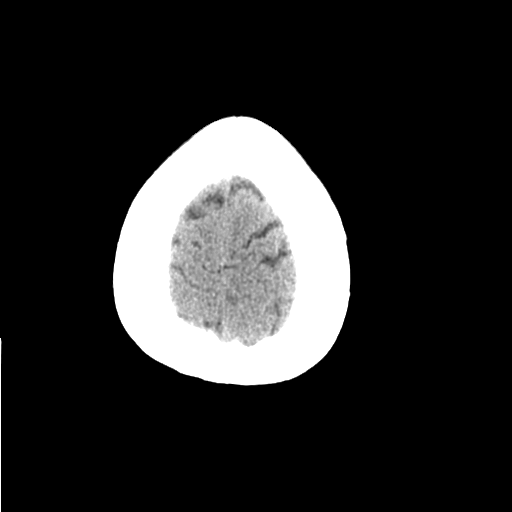
[im 28/31  bone]
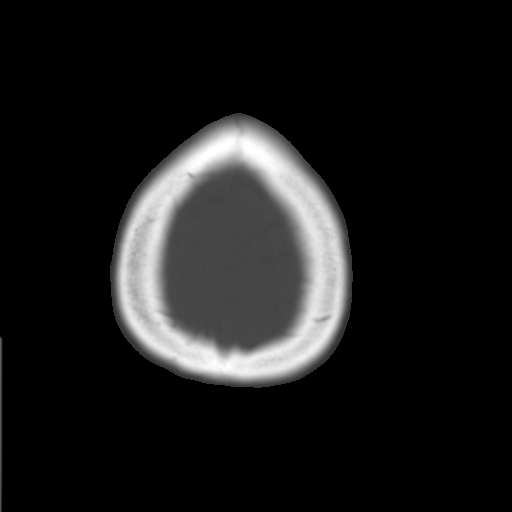

[17 of 30 positions shown; findings below may reference images not displayed]

FINDINGS: No intracranial abnormalities are identified, including
mass lesion or mass effect, hydrocephalus, extra-axial fluid
collection, midline shift, hemorrhage, or acute infarction.

The visualized bony calvarium is unremarkable.
IMPRESSION: Unremarkable noncontrast head CT.

## 2013-05-27 ENCOUNTER — Emergency Department (HOSPITAL_COMMUNITY): Payer: Medicaid Other

## 2013-05-27 ENCOUNTER — Emergency Department (HOSPITAL_COMMUNITY)
Admission: EM | Admit: 2013-05-27 | Discharge: 2013-05-28 | Disposition: A | Discharge status: Home / Self Care | Payer: Medicaid Other | Attending: Emergency Medicine | Admitting: Emergency Medicine

## 2013-05-27 ENCOUNTER — Encounter (HOSPITAL_COMMUNITY): Payer: Self-pay | Admitting: Emergency Medicine

## 2013-05-27 DIAGNOSIS — R5381 Other malaise: Secondary | ICD-10-CM | POA: Insufficient documentation

## 2013-05-27 DIAGNOSIS — Z1329 Encounter for screening for other suspected endocrine disorder: Secondary | ICD-10-CM

## 2013-05-27 DIAGNOSIS — R072 Precordial pain: Secondary | ICD-10-CM | POA: Insufficient documentation

## 2013-05-27 DIAGNOSIS — Z8659 Personal history of other mental and behavioral disorders: Secondary | ICD-10-CM | POA: Insufficient documentation

## 2013-05-27 DIAGNOSIS — R002 Palpitations: Secondary | ICD-10-CM | POA: Insufficient documentation

## 2013-05-27 DIAGNOSIS — R1909 Other intra-abdominal and pelvic swelling, mass and lump: Secondary | ICD-10-CM | POA: Insufficient documentation

## 2013-05-27 DIAGNOSIS — Z87440 Personal history of urinary (tract) infections: Secondary | ICD-10-CM | POA: Insufficient documentation

## 2013-05-27 DIAGNOSIS — Z79899 Other long term (current) drug therapy: Secondary | ICD-10-CM | POA: Insufficient documentation

## 2013-05-27 DIAGNOSIS — R42 Dizziness and giddiness: Secondary | ICD-10-CM | POA: Insufficient documentation

## 2013-05-27 DIAGNOSIS — R635 Abnormal weight gain: Secondary | ICD-10-CM | POA: Insufficient documentation

## 2013-05-27 DIAGNOSIS — F988 Other specified behavioral and emotional disorders with onset usually occurring in childhood and adolescence: Secondary | ICD-10-CM | POA: Insufficient documentation

## 2013-05-27 DIAGNOSIS — M7989 Other specified soft tissue disorders: Secondary | ICD-10-CM | POA: Insufficient documentation

## 2013-05-27 DIAGNOSIS — R079 Chest pain, unspecified: Secondary | ICD-10-CM

## 2013-05-27 DIAGNOSIS — F17200 Nicotine dependence, unspecified, uncomplicated: Secondary | ICD-10-CM | POA: Insufficient documentation

## 2013-05-27 DIAGNOSIS — R220 Localized swelling, mass and lump, head: Secondary | ICD-10-CM | POA: Insufficient documentation

## 2013-05-27 DIAGNOSIS — R221 Localized swelling, mass and lump, neck: Secondary | ICD-10-CM

## 2013-05-27 DIAGNOSIS — E039 Hypothyroidism, unspecified: Secondary | ICD-10-CM | POA: Insufficient documentation

## 2013-05-27 DIAGNOSIS — Z91040 Latex allergy status: Secondary | ICD-10-CM | POA: Insufficient documentation

## 2013-05-27 DIAGNOSIS — R5383 Other fatigue: Secondary | ICD-10-CM

## 2013-05-27 LAB — BASIC METABOLIC PANEL
BUN: 16 mg/dL (ref 6–23)
CO2: 24 mEq/L (ref 19–32)
Calcium: 9.4 mg/dL (ref 8.4–10.5)
Chloride: 103 mEq/L (ref 96–112)
Creatinine, Ser: 0.69 mg/dL (ref 0.50–1.10)
GFR calc Af Amer: >90 mL/min (ref >90)
GFR calc non Af Amer: >90 mL/min (ref >90)
Glucose, Bld: 84 mg/dL (ref 70–99)
Potassium: 6.6 mEq/L (ref 3.7–5.3)
Sodium: 139 mEq/L (ref 137–147)

## 2013-05-27 LAB — TROPONIN I: Troponin I: <0.3 ng/mL (ref <0.30)

## 2013-05-27 LAB — PRO B NATRIURETIC PEPTIDE: Pro B Natriuretic peptide (BNP): 15.4 pg/mL (ref 0–125)

## 2013-05-27 LAB — CBC
HCT: 42.5 % (ref 36.0–46.0)
Hemoglobin: 14.6 g/dL (ref 12.0–15.0)
MCH: 31.3 pg (ref 26.0–34.0)
MCHC: 34.4 g/dL (ref 30.0–36.0)
MCV: 91 fL (ref 78.0–100.0)
Platelets: 236 10*3/uL (ref 150–400)
RBC: 4.67 MIL/uL (ref 3.87–5.11)
RDW: 13 % (ref 11.5–15.5)
WBC: 8.7 10*3/uL (ref 4.0–10.5)

## 2013-05-27 LAB — TSH: TSH: 0.779 u[IU]/mL (ref 0.350–4.500)

## 2013-05-27 LAB — POTASSIUM: Potassium: 4.3 mEq/L (ref 3.7–5.3)

## 2013-05-27 LAB — I-STAT TROPONIN, ED: Troponin i, poc: 0 ng/mL (ref 0.00–0.08)

## 2013-05-27 MED ORDER — SODIUM CHLORIDE 0.9 % IV BOLUS (SEPSIS): 1000.0000 mL | Freq: Once | INTRAVENOUS | Status: DC

## 2013-05-27 MED ORDER — LORAZEPAM 0.5 MG PO TABS
0.5000 mg | ORAL_TABLET | Freq: Once | ORAL | Status: AC
  Administered 2013-05-27: 0.5 mg via ORAL
  Filled 2013-05-27: qty 1

## 2013-05-27 MED ORDER — HYDROCODONE-ACETAMINOPHEN 5-325 MG PO TABS
1.0000 | ORAL_TABLET | Freq: Once | ORAL | Status: AC
  Administered 2013-05-27: 1 via ORAL
  Filled 2013-05-27: qty 1

## 2013-05-27 MED ORDER — SODIUM CHLORIDE 0.9 % IV BOLUS (SEPSIS)
1000.0000 mL | Freq: Once | INTRAVENOUS | Status: AC
  Administered 2013-05-27: 1000 mL via INTRAVENOUS

## 2013-05-27 NOTE — ED Notes (Signed)
[name] ClientHannah PA stated it's ok for pt to eat and drink. Pt given ginger ale and crackers per request.

## 2013-05-27 NOTE — ED Notes (Signed)
Pt states she has had heart palpitations x 3 days. States today she developed chest pain today around noon. States pain is in mid chest. Pt denies nausea. States she has intermittent dizziness. Pt also reports that she has "swelling everywhere." States she thinks she has gained 10 lbs in one week. Pt alert, no acute distress. Skin warm and dry.

## 2013-05-27 NOTE — ED Provider Notes (Signed)
CSN: [id number]     Arrival date & time 05/27/13  1732 History   First MD Initiated Contact with Patient 05/27/13 1751     Chief Complaint  Patient presents with  . Chest Pain  . Dizziness     (Consider location/radiation/quality/duration/timing/severity/associated sxs/prior Treatment) The history is provided by the patient and medical records. No language interpreter was used.    [name] is a 38 y.o. female  with a hx of hypothyroid, ADD, depression presents to the Emergency Department complaining of gradual, persistent, progressively worsening palpitations, lightheadedness onset 3 days ago but worsening today.  Pt also reports > 1 week of fatigue.  Associated symptoms include chest pain beginning at noon that is located in the L chest that radiates into the left shoulder.  Pt reports "swelling" in her face, abd.  Pt reports taking synthroid which she stopped 3 weeks ago because her levels were normal.  Patient reports "all her labs were checked for a preemployment screening several weeks ago and they were all normal" prompting her to stop her Synthroid.  I cautioned her TSH level and she reports they "checked everything."  Pt reports that she gained 12 pounds in the last week.  No aggravating or alleviating factors, but she has not tried anything. Pt denies fever, chills, headache, neck pain, SOB, abd pain, N/V/D, weakness, syncope, dysuria, hematuria.     PCP: [facility]   Past Medical History  Diagnosis Date  . Thyroid disease   . ADD (attention deficit disorder)   . Bipolar 1 disorder   . Depression   . ADD (attention deficit disorder)   . Hypothyroid   . Urinary tract infection    Past Surgical History  Procedure Laterality Date  . Mouth surgery    . Tubal ligation    . Vaginal delivery      X 6   Family History  Problem Relation Age of Onset  . Cancer Other   . Heart failure Other    History  Substance Use Topics  . Smoking status: Current Every Day Smoker --  0.25 packs/day    Types: Cigarettes  . Smokeless tobacco: Never Used  . Alcohol Use: No   OB History   Grav Para Term Preterm Abortions TAB SAB Ect Mult Living   7 6 6  1  1   6      Review of Systems  Constitutional: Negative for fever, diaphoresis, appetite change, fatigue and unexpected weight change.  HENT: Negative for mouth sores.   Eyes: Negative for visual disturbance.  Respiratory: Negative for cough, chest tightness, shortness of breath and wheezing.   Cardiovascular: Positive for chest pain and leg swelling.  Gastrointestinal: Negative for nausea, vomiting, abdominal pain, diarrhea and constipation.  Endocrine: Negative for polydipsia, polyphagia and polyuria.  Genitourinary: Negative for dysuria, urgency, frequency and hematuria.  Musculoskeletal: Negative for back pain and neck stiffness.  Skin: Negative for rash.  Allergic/Immunologic: Negative for immunocompromised state.  Neurological: Negative for syncope, light-headedness and headaches.  Hematological: Does not bruise/bleed easily.  Psychiatric/Behavioral: Negative for sleep disturbance. The patient is not nervous/anxious.       Allergies  Latex and Phenergan  Home Medications   Prior to Admission medications   Medication Sig Start Date End Date Taking? Authorizing Provider  amphetamine-dextroamphetamine (ADDERALL) 20 MG tablet Take 20 mg by mouth daily.    Historical Provider, MD  furosemide (LASIX) 20 MG tablet Take 20 mg by mouth daily as needed for fluid.  Historical Provider, MD  levothyroxine (SYNTHROID, LEVOTHROID) 50 MCG tablet Take 50 mcg by mouth every morning.     Historical Provider, MD   BP 95/72  Pulse 83  Temp(Src) 97.6 F (36.4 C) (Oral)  Resp 19  SpO2 96%  LMP 05/25/2013 Physical Exam  Nursing note and vitals reviewed. Constitutional: She is oriented to person, place, and time. She appears well-developed and well-nourished. No distress.  Awake, alert, nontoxic appearance  HENT:   Head: Normocephalic and atraumatic.  Mouth/Throat: Oropharynx is clear and moist. No oropharyngeal exudate.  Eyes: Conjunctivae and EOM are normal. Pupils are equal, round, and reactive to light. No scleral icterus.  No horizontal, vertical, or rotational nystagmus  Neck: Normal range of motion. Neck supple.  Cardiovascular: Normal rate, regular rhythm, normal heart sounds and intact distal pulses.   No murmur heard. RRR  Pulmonary/Chest: Effort normal and breath sounds normal. No respiratory distress. She has no wheezes. She exhibits tenderness.  Mild anterior chest tenderness with palpation  Abdominal: Soft. Bowel sounds are normal. She exhibits no distension and no mass. There is no tenderness. There is no rebound and no guarding.  Soft and nontender No distension  Musculoskeletal: Normal range of motion. She exhibits no edema.  No edema of the face, abdomen, hands, calves or ankles though pt reports she is swollen  Lymphadenopathy:    She has no cervical adenopathy.  Neurological: She is alert and oriented to person, place, and time. She exhibits normal muscle tone. Coordination normal.  Mental Status:  Alert, oriented, thought content appropriate. Speech fluent without evidence of aphasia. Able to follow 2 step commands without difficulty.  Cranial Nerves:  II:  Peripheral visual fields grossly normal, pupils equal, round, reactive to light III,IV, VI: ptosis not present, extra-ocular motions intact bilaterally  V,VII: smile symmetric, facial light touch sensation equal VIII: hearing grossly normal bilaterally  IX,X: gag reflex present  XI: bilateral shoulder shrug equal and strong XII: midline tongue extension  Motor:  5/5 in upper and lower extremities bilaterally including strong and equal grip strength and dorsiflexion/plantar flexion Sensory: Pinprick and light touch normal in all extremities.  Deep Tendon Reflexes: 2+ and symmetric  Cerebellar: normal finger-to-nose with  bilateral upper extremities Gait: normal gait and balance CV: distal pulses palpable throughout   Skin: Skin is warm and dry. She is not diaphoretic. No erythema.  Psychiatric: She has a normal mood and affect. Her behavior is normal.    ED Course  Procedures (including critical care time) Labs Review Labs Reviewed  BASIC METABOLIC PANEL - Abnormal; Notable for the following:    Potassium 6.6 (*)    All other components within normal limits  CBC  PRO B NATRIURETIC PEPTIDE  POTASSIUM  TSH  TROPONIN I  T4, FREE  I-STAT TROPOININ, ED    Imaging Review Dg Chest 2 View  05/27/2013   CLINICAL DATA:  Generalized swelling.  Palpitations.  Chest pain.  EXAM: CHEST  2 VIEW  COMPARISON:  DG CHEST 2 VIEW dated 02/10/2013  FINDINGS: Cardiopericardial silhouette within normal limits. Mediastinal contours normal. Trachea midline. No airspace disease or effusion. Monitoring leads project over the chest.  IMPRESSION: No active cardiopulmonary disease.   Electronically Signed   By: Andreas NewportGeoffrey  Lamke M.D.   On: 05/27/2013 18:41     EKG Interpretation None      ECG:  Date: 05/28/2013  Rate: 92  Rhythm: normal sinus rhythm  QRS Axis: normal  Intervals: normal  ST/T Wave abnormalities: normal  Conduction Disutrbances:none  Narrative Interpretation: Sinus rhythm, nonischemic, unchanged from 02/21/2013  Old EKG Reviewed: unchanged    MDM   Final diagnoses:  Chest pain  Thyroid disorder screen   Charlene Rough presents with c/o chest pain, dizziness, and swelling everywhere.  No obvious swelling. Specifically no edema of the feet or legs, no edema of the hands or arms, no edema of the face.  She reports midsternal chest pain.  Patient also reports that she stopped her Synthroid for her own accord 3 weeks ago. We'll check TSH and basic labs. 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  7:35PM TSH pending.   BMP  with hyperkalemia. Will repeat.  Labs otherwise unremarkable.  ECG:   8:45PM Repeat K+ WNL - 4.3.  TSH continues to pend.  Pt reports chest pain is only somewhat improved with the hot pack.  CXR without PNA, pneumothorax of pulmonary edema.    11:46 PM Pt with resolved chest pain after hydrocodone.  She reports feeling much better.  TSH low normal.  Pt is to stay off her synthroid and follow-up with her PCP in 3 days for further evaluation.  Pt is to return to the ED for continued swelling.    Chest pain is not likely of cardiac or pulmonary etiology d/t presentation, PERC negative, VSS without tachycardia, no tracheal deviation, no JVD or new murmur, RRR, breath sounds equal bilaterally, EKG without acute abnormalities, negative troponin, and negative CXR. Pt has been advised to take ibuprofen and return to the ED if CP becomes exertional, associated with diaphoresis or nausea, radiates to left jaw/arm, worsens or becomes concerning in any way. Pt appears reliable for follow up and is agreeable to discharge.    It has been determined that no acute conditions requiring further emergency intervention are present at this time. The patient/guardian have been advised of the diagnosis and plan. We have discussed signs and symptoms that warrant return to the ED, such as changes or worsening in symptoms.   Vital signs are stable at discharge.   BP 95/72  Pulse 83  Temp(Src) 97.6 F (36.4 C) (Oral)  Resp 19  SpO2 96%  LMP 05/25/2013  Patient/guardian has voiced understanding and agreed to follow-up with the PCP or specialist.      Dierdre Forth, PA-C 05/28/13 0004

## 2013-05-27 NOTE — Discharge Instructions (Signed)
1. Medications: usual home medications 2. Treatment: rest, drink plenty of fluids,  3. Follow Up: Please followup with your primary doctor in 3 days for discussion of your diagnoses and further evaluation after today's visit;     Chest Pain (Nonspecific) It is often hard to give a specific diagnosis for the cause of chest pain. There is always a chance that your pain could be related to something serious, such as a heart attack or a blood clot in the lungs. You need to follow up with your caregiver for further evaluation. CAUSES   Heartburn.  Pneumonia or bronchitis.  Anxiety or stress.  Inflammation around your heart (pericarditis) or lung (pleuritis or pleurisy).  A blood clot in the lung.  A collapsed lung (pneumothorax). It can develop suddenly on its own (spontaneous pneumothorax) or from injury (trauma) to the chest.  Shingles infection (herpes zoster virus). The chest wall is composed of bones, muscles, and cartilage. Any of these can be the source of the pain.  The bones can be bruised by injury.  The muscles or cartilage can be strained by coughing or overwork.  The cartilage can be affected by inflammation and become sore (costochondritis). DIAGNOSIS  Lab tests or other studies, such as X-rays, electrocardiography, stress testing, or cardiac imaging, may be needed to find the cause of your pain.  TREATMENT   Treatment depends on what may be causing your chest pain. Treatment may include:  Acid blockers for heartburn.  Anti-inflammatory medicine.  Pain medicine for inflammatory conditions.  Antibiotics if an infection is present.  You may be advised to change lifestyle habits. This includes stopping smoking and avoiding alcohol, caffeine, and chocolate.  You may be advised to keep your head raised (elevated) when sleeping. This reduces the chance of acid going backward from your stomach into your esophagus.  Most of the time, nonspecific chest pain will  improve within 2 to 3 days with rest and mild pain medicine. HOME CARE INSTRUCTIONS   If antibiotics were prescribed, take your antibiotics as directed. Finish them even if you start to feel better.  For the next few days, avoid physical activities that bring on chest pain. Continue physical activities as directed.  Do not smoke.  Avoid drinking alcohol.  Only take over-the-counter or prescription medicine for pain, discomfort, or fever as directed by your caregiver.  Follow your caregiver's suggestions for further testing if your chest pain does not go away.  Keep any follow-up appointments you made. If you do not go to an appointment, you could develop lasting (chronic) problems with pain. If there is any problem keeping an appointment, you must call to reschedule. SEEK MEDICAL CARE IF:   You think you are having problems from the medicine you are taking. Read your medicine instructions carefully.  Your chest pain does not go away, even after treatment.  You develop a rash with blisters on your chest. SEEK IMMEDIATE MEDICAL CARE IF:   You have increased chest pain or pain that spreads to your arm, neck, jaw, back, or abdomen.  You develop shortness of breath, an increasing cough, or you are coughing up blood.  You have severe back or abdominal pain, feel nauseous, or vomit.  You develop severe weakness, fainting, or chills.  You have a fever. THIS IS AN EMERGENCY. Do not wait to see if the pain will go away. Get medical help at once. Call your local emergency services (911 in [place] Do not drive yourself to the hospital. MAKE SURE  YOU:   Understand these instructions.  Will watch your condition.  Will get help right away if you are not doing well or get worse. Document Released: 11/01/2004 Document Revised: 04/16/2011 Document Reviewed: 08/28/2007 [facility] Patient Information 2014 [employer], [place].

## 2013-05-28 LAB — T4, FREE: Free T4: 1.24 ng/dL (ref 0.80–1.80)

## 2013-05-29 NOTE — ED Provider Notes (Signed)
Medical screening examination/treatment/procedure(s) were performed by non-physician practitioner and as supervising physician I was immediately available for consultation/collaboration.   EKG Interpretation   Date/Time:  Wednesday May 27 2013 17:40:14 EDT Ventricular Rate:  92 PR Interval:  132 QRS Duration: 73 QT Interval:  360 QTC Calculation: 445 R Axis:   22 Text Interpretation:  Sinus rhythm ED PHYSICIAN INTERPRETATION AVAILABLE  IN [facility] Confirmed by TEST, Record ([id number]) on 05/29/2013 7:01:24  AM       Raeford RazorStephen Robet Crutchfield, MD 05/29/13 [phone]

## 2013-11-03 ENCOUNTER — Encounter (HOSPITAL_COMMUNITY): Payer: Self-pay | Admitting: Emergency Medicine

## 2013-11-03 ENCOUNTER — Inpatient Hospital Stay (HOSPITAL_COMMUNITY)
Admission: EM | Admit: 2013-11-03 | Discharge: 2013-11-06 | DRG: 872 | Disposition: A | Discharge status: Home / Self Care | Payer: 59 | Attending: Family Medicine | Admitting: Family Medicine

## 2013-11-03 DIAGNOSIS — B9620 Unspecified Escherichia coli [E. coli] as the cause of diseases classified elsewhere: Secondary | ICD-10-CM | POA: Diagnosis present

## 2013-11-03 DIAGNOSIS — E039 Hypothyroidism, unspecified: Secondary | ICD-10-CM | POA: Diagnosis present

## 2013-11-03 DIAGNOSIS — I9589 Other hypotension: Secondary | ICD-10-CM | POA: Diagnosis present

## 2013-11-03 DIAGNOSIS — F988 Other specified behavioral and emotional disorders with onset usually occurring in childhood and adolescence: Secondary | ICD-10-CM | POA: Diagnosis present

## 2013-11-03 DIAGNOSIS — A419 Sepsis, unspecified organism: Principal | ICD-10-CM | POA: Diagnosis present

## 2013-11-03 DIAGNOSIS — I959 Hypotension, unspecified: Secondary | ICD-10-CM | POA: Diagnosis present

## 2013-11-03 DIAGNOSIS — F319 Bipolar disorder, unspecified: Secondary | ICD-10-CM | POA: Diagnosis present

## 2013-11-03 DIAGNOSIS — Z87891 Personal history of nicotine dependence: Secondary | ICD-10-CM

## 2013-11-03 DIAGNOSIS — R109 Unspecified abdominal pain: Secondary | ICD-10-CM

## 2013-11-03 DIAGNOSIS — N12 Tubulo-interstitial nephritis, not specified as acute or chronic: Secondary | ICD-10-CM

## 2013-11-03 DIAGNOSIS — E669 Obesity, unspecified: Secondary | ICD-10-CM | POA: Diagnosis present

## 2013-11-03 DIAGNOSIS — N390 Urinary tract infection, site not specified: Secondary | ICD-10-CM | POA: Diagnosis present

## 2013-11-03 DIAGNOSIS — Z6834 Body mass index (BMI) 34.0-34.9, adult: Secondary | ICD-10-CM

## 2013-11-03 LAB — POC URINE PREG, ED: PREG TEST UR: NEGATIVE

## 2013-11-03 NOTE — ED Notes (Signed)
The patient says she started having back pain that radiates to the lower abdomen.  The patient denies any vaginal discharge, but does say her urine has a foul smell to it.  She rates her pain 10/10 and it radiates from the back to the lower abdomen on the left side.

## 2013-11-04 ENCOUNTER — Encounter (HOSPITAL_COMMUNITY): Payer: Self-pay | Admitting: Radiology

## 2013-11-04 ENCOUNTER — Emergency Department (HOSPITAL_COMMUNITY): Payer: 59

## 2013-11-04 DIAGNOSIS — N12 Tubulo-interstitial nephritis, not specified as acute or chronic: Secondary | ICD-10-CM | POA: Diagnosis present

## 2013-11-04 DIAGNOSIS — I959 Hypotension, unspecified: Secondary | ICD-10-CM | POA: Diagnosis present

## 2013-11-04 DIAGNOSIS — B9620 Unspecified Escherichia coli [E. coli] as the cause of diseases classified elsewhere: Secondary | ICD-10-CM | POA: Diagnosis present

## 2013-11-04 DIAGNOSIS — Z87891 Personal history of nicotine dependence: Secondary | ICD-10-CM | POA: Diagnosis not present

## 2013-11-04 DIAGNOSIS — E669 Obesity, unspecified: Secondary | ICD-10-CM | POA: Diagnosis present

## 2013-11-04 DIAGNOSIS — Z6834 Body mass index (BMI) 34.0-34.9, adult: Secondary | ICD-10-CM | POA: Diagnosis not present

## 2013-11-04 DIAGNOSIS — A419 Sepsis, unspecified organism: Secondary | ICD-10-CM | POA: Diagnosis present

## 2013-11-04 DIAGNOSIS — F319 Bipolar disorder, unspecified: Secondary | ICD-10-CM | POA: Diagnosis present

## 2013-11-04 DIAGNOSIS — R109 Unspecified abdominal pain: Secondary | ICD-10-CM

## 2013-11-04 DIAGNOSIS — F988 Other specified behavioral and emotional disorders with onset usually occurring in childhood and adolescence: Secondary | ICD-10-CM | POA: Diagnosis present

## 2013-11-04 DIAGNOSIS — E039 Hypothyroidism, unspecified: Secondary | ICD-10-CM | POA: Diagnosis present

## 2013-11-04 DIAGNOSIS — N390 Urinary tract infection, site not specified: Secondary | ICD-10-CM | POA: Diagnosis present

## 2013-11-04 DIAGNOSIS — I9589 Other hypotension: Secondary | ICD-10-CM | POA: Diagnosis present

## 2013-11-04 LAB — CBC
HCT: 34.6 % — ABNORMAL LOW (ref 36.0–46.0)
HEMOGLOBIN: 11.8 g/dL — AB (ref 12.0–15.0)
MCH: 31.1 pg (ref 26.0–34.0)
MCHC: 34.1 g/dL (ref 30.0–36.0)
MCV: 91.3 fL (ref 78.0–100.0)
Platelets: 214 10*3/uL (ref 150–400)
RBC: 3.79 MIL/uL — ABNORMAL LOW (ref 3.87–5.11)
RDW: 12.8 % (ref 11.5–15.5)
WBC: 5.9 10*3/uL (ref 4.0–10.5)

## 2013-11-04 LAB — URINALYSIS, ROUTINE W REFLEX MICROSCOPIC
Bilirubin Urine: NEGATIVE
GLUCOSE, UA: NEGATIVE mg/dL
Ketones, ur: NEGATIVE mg/dL
NITRITE: POSITIVE — AB
PROTEIN: NEGATIVE mg/dL
Specific Gravity, Urine: 1.02 (ref 1.005–1.030)
UROBILINOGEN UA: 0.2 mg/dL (ref 0.0–1.0)
pH: 5.5 (ref 5.0–8.0)

## 2013-11-04 LAB — COMPREHENSIVE METABOLIC PANEL
ALBUMIN: 3.6 g/dL (ref 3.5–5.2)
ALK PHOS: 56 U/L (ref 39–117)
ALT: 14 U/L (ref 0–35)
ANION GAP: 12 (ref 5–15)
AST: 14 U/L (ref 0–37)
BILIRUBIN TOTAL: 0.4 mg/dL (ref 0.3–1.2)
BUN: 11 mg/dL (ref 6–23)
CO2: 27 mEq/L (ref 19–32)
Calcium: 9.2 mg/dL (ref 8.4–10.5)
Chloride: 103 mEq/L (ref 96–112)
Creatinine, Ser: 0.87 mg/dL (ref 0.50–1.10)
GFR calc Af Amer: >90 mL/min (ref >90)
GFR calc non Af Amer: 85 mL/min — ABNORMAL LOW (ref >90)
Glucose, Bld: 87 mg/dL (ref 70–99)
Potassium: 3.8 mEq/L (ref 3.7–5.3)
Sodium: 142 mEq/L (ref 137–147)
TOTAL PROTEIN: 7.6 g/dL (ref 6.0–8.3)

## 2013-11-04 LAB — CBC WITH DIFFERENTIAL/PLATELET
Basophils Absolute: 0 10*3/uL (ref 0.0–0.1)
Basophils Relative: 1 % (ref 0–1)
Eosinophils Absolute: 0.1 10*3/uL (ref 0.0–0.7)
Eosinophils Relative: 2 % (ref 0–5)
HCT: 40.5 % (ref 36.0–46.0)
Hemoglobin: 13.6 g/dL (ref 12.0–15.0)
LYMPHS PCT: 41 % (ref 12–46)
Lymphs Abs: 3.4 10*3/uL (ref 0.7–4.0)
MCH: 30 pg (ref 26.0–34.0)
MCHC: 33.6 g/dL (ref 30.0–36.0)
MCV: 89.2 fL (ref 78.0–100.0)
Monocytes Absolute: 0.5 10*3/uL (ref 0.1–1.0)
Monocytes Relative: 6 % (ref 3–12)
NEUTROS ABS: 4.2 10*3/uL (ref 1.7–7.7)
NEUTROS PCT: 50 % (ref 43–77)
PLATELETS: 264 10*3/uL (ref 150–400)
RBC: 4.54 MIL/uL (ref 3.87–5.11)
RDW: 12.5 % (ref 11.5–15.5)
WBC: 8.2 10*3/uL (ref 4.0–10.5)

## 2013-11-04 LAB — BASIC METABOLIC PANEL
Anion gap: 9 (ref 5–15)
BUN: 11 mg/dL (ref 6–23)
CO2: 22 meq/L (ref 19–32)
Calcium: 7.3 mg/dL — ABNORMAL LOW (ref 8.4–10.5)
Chloride: 113 mEq/L — ABNORMAL HIGH (ref 96–112)
Creatinine, Ser: 0.77 mg/dL (ref 0.50–1.10)
GFR calc non Af Amer: >90 mL/min (ref >90)
GLUCOSE: 81 mg/dL (ref 70–99)
Potassium: 4 mEq/L (ref 3.7–5.3)
SODIUM: 144 meq/L (ref 137–147)

## 2013-11-04 LAB — URINE MICROSCOPIC-ADD ON

## 2013-11-04 LAB — LACTIC ACID, PLASMA: LACTIC ACID, VENOUS: 0.9 mmol/L (ref 0.5–2.2)

## 2013-11-04 MED ORDER — ONDANSETRON HCL 4 MG/2ML IJ SOLN
4.0000 mg | Freq: Once | INTRAMUSCULAR | Status: AC
  Administered 2013-11-04: 4 mg via INTRAVENOUS
  Filled 2013-11-04: qty 2

## 2013-11-04 MED ORDER — LEVOTHYROXINE SODIUM 50 MCG PO TABS
50.0000 ug | ORAL_TABLET | Freq: Every day | ORAL | Status: DC
  Administered 2013-11-04 – 2013-11-06 (×3): 50 ug via ORAL
  Filled 2013-11-04 (×5): qty 1

## 2013-11-04 MED ORDER — OXYCODONE-ACETAMINOPHEN 5-325 MG PO TABS: 1.0000 | ORAL_TABLET | ORAL | Status: DC | PRN

## 2013-11-04 MED ORDER — SODIUM CHLORIDE 0.9 % IV BOLUS (SEPSIS)
1000.0000 mL | Freq: Once | INTRAVENOUS | Status: AC
  Administered 2013-11-04: 1000 mL via INTRAVENOUS

## 2013-11-04 MED ORDER — ACETAMINOPHEN 325 MG PO TABS
650.0000 mg | ORAL_TABLET | Freq: Four times a day (QID) | ORAL | Status: DC | PRN
  Administered 2013-11-04 – 2013-11-06 (×2): 650 mg via ORAL
  Filled 2013-11-04 (×2): qty 2

## 2013-11-04 MED ORDER — MORPHINE SULFATE 4 MG/ML IJ SOLN
4.0000 mg | Freq: Once | INTRAMUSCULAR | Status: AC
  Administered 2013-11-04: 4 mg via INTRAVENOUS
  Filled 2013-11-04: qty 1

## 2013-11-04 MED ORDER — IBUPROFEN 600 MG PO TABS
600.0000 mg | ORAL_TABLET | Freq: Four times a day (QID) | ORAL | Status: DC | PRN
  Administered 2013-11-04: 600 mg via ORAL
  Filled 2013-11-04 (×2): qty 1

## 2013-11-04 MED ORDER — ACETAMINOPHEN 650 MG RE SUPP: 650.0000 mg | Freq: Four times a day (QID) | RECTAL | Status: DC | PRN

## 2013-11-04 MED ORDER — ONDANSETRON HCL 4 MG PO TABS: 4.0000 mg | ORAL_TABLET | Freq: Four times a day (QID) | ORAL | Status: DC | PRN

## 2013-11-04 MED ORDER — FENTANYL CITRATE 0.05 MG/ML IJ SOLN
25.0000 ug | INTRAMUSCULAR | Status: DC | PRN
  Administered 2013-11-04: 25 ug via INTRAVENOUS
  Filled 2013-11-04: qty 2

## 2013-11-04 MED ORDER — HYDROMORPHONE HCL 1 MG/ML IJ SOLN
1.0000 mg | Freq: Once | INTRAMUSCULAR | Status: DC
  Filled 2013-11-04: qty 1

## 2013-11-04 MED ORDER — FENTANYL CITRATE 0.05 MG/ML IJ SOLN
50.0000 ug | Freq: Once | INTRAMUSCULAR | Status: AC
  Administered 2013-11-04: 50 ug via INTRAVENOUS

## 2013-11-04 MED ORDER — FENTANYL CITRATE 0.05 MG/ML IJ SOLN
INTRAMUSCULAR | Status: AC
  Filled 2013-11-04: qty 2

## 2013-11-04 MED ORDER — ONDANSETRON HCL 4 MG PO TABS: 4.0000 mg | ORAL_TABLET | Freq: Four times a day (QID) | ORAL | Status: DC

## 2013-11-04 MED ORDER — ONDANSETRON HCL 4 MG/2ML IJ SOLN: 4.0000 mg | Freq: Four times a day (QID) | INTRAMUSCULAR | Status: DC | PRN

## 2013-11-04 MED ORDER — DEXTROSE 5 % IV SOLN
1.0000 g | Freq: Once | INTRAVENOUS | Status: AC
  Administered 2013-11-04: 1 g via INTRAVENOUS
  Filled 2013-11-04: qty 10

## 2013-11-04 MED ORDER — SODIUM CHLORIDE 0.9 % IV SOLN
INTRAVENOUS | Status: DC
  Administered 2013-11-04: 100 mL/h via INTRAVENOUS
  Administered 2013-11-04 (×2): via INTRAVENOUS
  Administered 2013-11-05: 100 mL/h via INTRAVENOUS
  Administered 2013-11-05: 17:00:00 via INTRAVENOUS
  Administered 2013-11-06: 100 mL/h via INTRAVENOUS

## 2013-11-04 MED ORDER — OXYCODONE HCL 5 MG PO TABS
5.0000 mg | ORAL_TABLET | ORAL | Status: DC | PRN
  Administered 2013-11-04 – 2013-11-06 (×4): 5 mg via ORAL
  Filled 2013-11-04 (×4): qty 1

## 2013-11-04 MED ORDER — CEPHALEXIN 500 MG PO CAPS: ORAL_CAPSULE | ORAL | Status: DC

## 2013-11-04 MED ORDER — ENOXAPARIN SODIUM 40 MG/0.4ML ~~LOC~~ SOLN
40.0000 mg | SUBCUTANEOUS | Status: DC
  Administered 2013-11-05: 40 mg via SUBCUTANEOUS
  Filled 2013-11-04 (×3): qty 0.4

## 2013-11-04 MED ORDER — AMPHETAMINE-DEXTROAMPHETAMINE 10 MG PO TABS
10.0000 mg | ORAL_TABLET | Freq: Every day | ORAL | Status: DC
  Administered 2013-11-04 – 2013-11-06 (×3): 10 mg via ORAL
  Filled 2013-11-04 (×3): qty 1

## 2013-11-04 MED ORDER — SODIUM CHLORIDE 0.9 % IV BOLUS (SEPSIS)
750.0000 mL | Freq: Once | INTRAVENOUS | Status: AC
  Administered 2013-11-04: 750 mL via INTRAVENOUS

## 2013-11-04 MED ORDER — DIPHENHYDRAMINE HCL 25 MG PO CAPS
25.0000 mg | ORAL_CAPSULE | ORAL | Status: DC | PRN
  Administered 2013-11-04 – 2013-11-05 (×2): 25 mg via ORAL
  Filled 2013-11-04 (×2): qty 1

## 2013-11-04 MED ORDER — FENTANYL CITRATE 0.05 MG/ML IJ SOLN
50.0000 ug | Freq: Once | INTRAMUSCULAR | Status: AC
  Administered 2013-11-04: 50 ug via INTRAVENOUS
  Filled 2013-11-04: qty 2

## 2013-11-04 MED ORDER — ALUM & MAG HYDROXIDE-SIMETH 200-200-20 MG/5ML PO SUSP: 30.0000 mL | Freq: Four times a day (QID) | ORAL | Status: DC | PRN

## 2013-11-04 MED ORDER — DEXTROSE 5 % IV SOLN
1.0000 g | INTRAVENOUS | Status: DC
  Administered 2013-11-05 – 2013-11-06 (×2): 1 g via INTRAVENOUS
  Filled 2013-11-04 (×2): qty 10

## 2013-11-04 MED ORDER — SODIUM CHLORIDE 0.9 % IV BOLUS (SEPSIS)
500.0000 mL | Freq: Once | INTRAVENOUS | Status: AC
  Administered 2013-11-04: 500 mL via INTRAVENOUS

## 2013-11-04 NOTE — Progress Notes (Signed)
Utilization review completed.  

## 2013-11-04 NOTE — ED Provider Notes (Signed)
CSN: [id number]     Arrival date & time 11/03/13  2323 History   First MD Initiated Contact with Patient 11/04/13 0001     Chief Complaint  Patient presents with  . Flank Pain    The patient says she started having back pain that radiates to the lower abdomen.  The patient denies any vaginal discharge, but does say her urine has a foul smell to it.     (Consider location/radiation/quality/duration/timing/severity/associated sxs/prior Treatment) HPI Comments: Patient is a 38 year old female with history of thyroid disease, ADD, bipolar 1 disorder who presents the emergency department for evaluation of flank pain. She reports that this afternoon she had sudden onset left-sided flank pain that began while she was driving. There was no injury to the area. She describes the pain as aching. Heat improves her pain. Walking and standing worsen her pain. She has not had pain like this in the past. She has associated chills without known fever. She has not taken anything to improve her pain. She denies dysuria, vaginal discharge, vaginal bleeding, frequency, urgency.   Patient is a 38 y.o. female presenting with flank pain. The history is provided by the patient. No language interpreter was used.  Flank Pain Associated symptoms include chills and nausea. Pertinent negatives include no fever or vomiting.    Past Medical History  Diagnosis Date  . Thyroid disease   . ADD (attention deficit disorder)   . Bipolar 1 disorder   . Depression   . ADD (attention deficit disorder)   . Hypothyroid   . Urinary tract infection    Past Surgical History  Procedure Laterality Date  . Mouth surgery    . Tubal ligation    . Vaginal delivery      X 6   Family History  Problem Relation Age of Onset  . Cancer Other   . Heart failure Other    History  Substance Use Topics  . Smoking status: Former Smoker -- 0.25 packs/day    Types: Cigarettes    Quit date: 10/13/2013  . Smokeless tobacco: Never Used  .  Alcohol Use: No   OB History   Grav Para Term Preterm Abortions TAB SAB Ect Mult Living   7 6 6  1  1   6      Review of Systems  Constitutional: Positive for chills. Negative for fever.  Gastrointestinal: Positive for nausea. Negative for vomiting.  Genitourinary: Positive for flank pain. Negative for dysuria.       Foul smelling urine  All other systems reviewed and are negative.     Allergies  Latex and Phenergan  Home Medications   Prior to Admission medications   Medication Sig Start Date End Date Taking? Authorizing Provider  amphetamine-dextroamphetamine (ADDERALL) 20 MG tablet Take 10 mg by mouth daily.    Yes Historical Provider, MD  levothyroxine (SYNTHROID, LEVOTHROID) 50 MCG tablet Take 50 mcg by mouth every morning.    Yes Historical Provider, MD  cephALEXin (KEFLEX) 500 MG capsule 2 caps po bid x 7 days 11/04/13   Mora Bellman, PA-C  ondansetron (ZOFRAN) 4 MG tablet Take 1 tablet (4 mg total) by mouth every 6 (six) hours. 11/04/13   Mora Bellman, PA-C  oxyCODONE-acetaminophen (PERCOCET/ROXICET) 5-325 MG per tablet Take 1 tablet by mouth every 4 (four) hours as needed for severe pain. May take 2 tablets PO q 6 hours for severe pain - Do not take with Tylenol as this tablet already contains tylenol  11/04/13   Mora Bellman, PA-C   BP 98/58  Pulse 83  Temp(Src) 98.1 F (36.7 C) (Oral)  Resp 17  Ht 5\' 6"  (1.676 m)  Wt 214 lb 7 oz (97.268 kg)  BMI 34.63 kg/m2  SpO2 98%  LMP 09/02/2013 Physical Exam  Nursing note and vitals reviewed. Constitutional: She is oriented to person, place, and time. She appears well-developed and well-nourished. No distress.  Patient appears uncomfortable.   HENT:  Head: Normocephalic and atraumatic.  Right Ear: External ear normal.  Left Ear: External ear normal.  Nose: Nose normal.  Mouth/Throat: Oropharynx is clear and moist.  Eyes: Conjunctivae are normal.  Neck: Normal range of motion.  Cardiovascular: Regular rhythm,  normal heart sounds, intact distal pulses and normal pulses.  Tachycardia present.   Pulses:      Radial pulses are 2+ on the right side, and 2+ on the left side.       Posterior tibial pulses are 2+ on the right side, and 2+ on the left side.  Pulmonary/Chest: Effort normal and breath sounds normal. No stridor. No respiratory distress. She has no wheezes. She has no rales.  Abdominal: Soft. She exhibits no distension. There is tenderness in the left lower quadrant. There is CVA tenderness (left).    Musculoskeletal: Normal range of motion.       Back:  Neurological: She is alert and oriented to person, place, and time. She has normal strength.  Skin: Skin is warm and dry. She is not diaphoretic. No erythema.  Psychiatric: She has a normal mood and affect. Her behavior is normal.    ED Course  Procedures (including critical care time) Labs Review Labs Reviewed  URINALYSIS, ROUTINE W REFLEX MICROSCOPIC - Abnormal; Notable for the following:    Color, Urine AMBER (*)    APPearance CLOUDY (*)    Hgb urine dipstick MODERATE (*)    Nitrite POSITIVE (*)    Leukocytes, UA SMALL (*)    All other components within normal limits  URINE MICROSCOPIC-ADD ON - Abnormal; Notable for the following:    Squamous Epithelial / LPF FEW (*)    Bacteria, UA MANY (*)    All other components within normal limits  COMPREHENSIVE METABOLIC PANEL - Abnormal; Notable for the following:    GFR calc non Af Amer 85 (*)    All other components within normal limits  URINE CULTURE  CBC WITH DIFFERENTIAL  POC URINE PREG, ED    Imaging Review Ct Abdomen Pelvis Wo Contrast  11/04/2013   CLINICAL DATA:  Back pain radiating to the lower abdomen. Foul odor to the urine.  EXAM: CT ABDOMEN AND PELVIS WITHOUT CONTRAST  TECHNIQUE: Multidetector CT imaging of the abdomen and pelvis was performed following the standard protocol without IV contrast.  COMPARISON:  02/01/2012  FINDINGS: Slight fibrosis or linear atelectasis  in the lung bases.  Kidneys appear symmetrical in size and shape. No hydronephrosis or hydroureter. No renal, ureteral, or bladder stones. Bladder is decompressed limiting assessment for bladder wall thickening.  The unenhanced appearance of the liver, spleen, pancreas, adrenal glands, abdominal aorta, inferior vena cava, and retroperitoneal lymph nodes is unremarkable. Stomach and small bowel are decompressed. Scattered stool in the colon without distention. No free air or free fluid in the abdomen. Minimal fat in the umbilical canal.  Pelvis: Appendix is normal. No evidence of diverticulitis. Uterus and ovaries are not enlarged. No free or loculated pelvic fluid collections. No pelvic mass or lymphadenopathy. No destructive  bone lesions. Normal alignment of the lumbar spine. No displaced fractures identified.  IMPRESSION: No renal or ureteral stone or obstruction. No acute process demonstrated in the abdomen or pelvis on noncontrast imaging.   Electronically Signed   By: Burman NievesWilliam  Stevens M.D.   On: 11/04/2013 01:40     EKG Interpretation None      MDM   Final diagnoses:  Left flank pain  Pyelonephritis   Patient presents to ED with left flank pain. CVA tenderness, UA appearing infected, likely pyelonephritis. Patient with improved tachycardia, but with persistent hypotension despite 3L of fluid. Patient given IV rocephin. Will admit for further management given persistent hypotension. Admission is appreciated. Discussed case with Dr. Ranae PalmsYelverton who agrees with plan. Patient / Family / Caregiver informed of clinical course, understand medical decision-making process, and agree with plan.   Mora BellmanHannah S Luisantonio Adinolfi, PA-C 11/05/13 [id number]

## 2013-11-04 NOTE — Progress Notes (Signed)
Report received from ED nurse.

## 2013-11-04 NOTE — Discharge Instructions (Signed)
Pyelonephritis, Adult Pyelonephritis is a kidney infection. In general, there are 2 main types of pyelonephritis:  Infections that come on quickly without any warning (acute pyelonephritis).  Infections that persist for a long period of time (chronic pyelonephritis). CAUSES  Two main causes of pyelonephritis are:  Bacteria traveling from the bladder to the kidney. This is a problem especially in pregnant women. The urine in the bladder can become filled with bacteria from multiple causes, including:  Inflammation of the prostate gland (prostatitis).  Sexual intercourse in females.  Bladder infection (cystitis).  Bacteria traveling from the bloodstream to the tissue part of the kidney. Problems that may increase your risk of getting a kidney infection include:  Diabetes.  Kidney stones or bladder stones.  Cancer.  Catheters placed in the bladder.  Other abnormalities of the kidney or ureter. SYMPTOMS   Abdominal pain.  Pain in the side or flank area.  Fever.  Chills.  Upset stomach.  Blood in the urine (dark urine).  Frequent urination.  Strong or persistent urge to urinate.  Burning or stinging when urinating. DIAGNOSIS  Your caregiver may diagnose your kidney infection based on your symptoms. A urine sample may also be taken. TREATMENT  In general, treatment depends on how severe the infection is.   If the infection is mild and caught early, your caregiver may treat you with oral antibiotics and send you home.  If the infection is more severe, the bacteria may have gotten into the bloodstream. This will require intravenous (IV) antibiotics and a hospital stay. Symptoms may include:  High fever.  Severe flank pain.  Shaking chills.  Even after a hospital stay, your caregiver may require you to be on oral antibiotics for a period of time.  Other treatments may be required depending upon the cause of the infection. HOME CARE INSTRUCTIONS   Take your  antibiotics as directed. Finish them even if you start to feel better.  Make an appointment to have your urine checked to make sure the infection is gone.  Drink enough fluids to keep your urine clear or pale yellow.  Take medicines for the bladder if you have urgency and frequency of urination as directed by your caregiver. SEEK IMMEDIATE MEDICAL CARE IF:   You have a fever or persistent symptoms for more than 2-3 days.  You have a fever and your symptoms suddenly get worse.  You are unable to take your antibiotics or fluids.  You develop shaking chills.  You experience extreme weakness or fainting.  There is no improvement after 2 days of treatment. MAKE SURE YOU:  Understand these instructions.  Will watch your condition.  Will get help right away if you are not doing well or get worse. Document Released: 01/22/2005 Document Revised: 07/24/2011 Document Reviewed: 06/28/2010 [facility] Patient Information 2015 [place]. This information is not intended to replace advice given to you by your health care provider. Make sure you discuss any questions you have with your health care provider.  Flank Pain Flank pain is pain in your side. The flank is the area of your side between your upper belly (abdomen) and your back. Pain in this area can be caused by many different things. HOME CARE Home care and treatment will depend on the cause of your pain.  Rest as told by your doctor.  Drink enough fluids to keep your pee (urine) clear or pale yellow.  Only take medicine as told by your doctor.  Tell your doctor about any changes in  your pain.  Follow up with your doctor. GET HELP RIGHT AWAY IF:   Your pain does not get better with medicine.   You have new symptoms or your symptoms get worse.  Your pain gets worse.   You have belly (abdominal) pain.   You are short of breath.   You always feel sick to your stomach (nauseous).   You keep throwing up  (vomiting).   You have puffiness (swelling) in your belly.   You feel light-headed or you pass out (faint).   You have blood in your pee.  You have a fever or lasting symptoms for more than 2-3 days.  You have a fever and your symptoms suddenly get worse. MAKE SURE YOU:   Understand these instructions.  Will watch your condition.  Will get help right away if you are not doing well or get worse. Document Released: 11/01/2007 Document Revised: 06/08/2013 Document Reviewed: 09/06/2011 [facility] Patient Information 2015 [place], [place]. This information is not intended to replace advice given to you by your health care provider. Make sure you discuss any questions you have with your health care provider.

## 2013-11-04 NOTE — H&P (Signed)
[facility] Admission History and Physical       [name] ZOX:[id number] DOB: 1975/08/21 DOA: 11/03/2013  Referring physician:  PCP: Pcp Not In System  Specialists:   Chief Complaint:  Left Sided Flank Pain and Chills  HPI: [name] is a 38 y.o. female with a history of Thyroid disease, ADD, and Bipolar Disorder who presents to the ED with complaints of left sided Flank pain and Chills x 2 days.    She denies dysuria, and hematuria.    She reports having nausea while she has been in the ED.   She was found to have hypotension, and a positive UA, ans a Urine culture was sent and she was placed on IV Rocephin.    She was observed to have hypotension and tachycardia, and IV fluids were administered for fluid resuscitation.   She was referred fro medical admission.       Review of Systems:  Constitutional: No Weight Loss, No Weight Gain, Night Sweats, Fevers, +Chills, Dizziness, Fatigue, or Generalized Weakness HEENT: No Headaches, Difficulty Swallowing,Tooth/Dental Problems,Sore Throat,  No Sneezing, Rhinitis, Ear Ache, Nasal Congestion, or Post Nasal Drip,  Cardio-vascular:  No Chest pain, Orthopnea, PND, Edema in Lower Extremities, Anasarca, Dizziness, Palpitations  Resp: No Dyspnea, No DOE, No Productive Cough, No Non-Productive Cough, No Hemoptysis, No Wheezing.    GI: No Heartburn, Indigestion, Abdominal Pain, Nausea, Vomiting, Diarrhea, Hematemesis, Hematochezia, Melena, Change in Bowel Habits,  Loss of Appetite  GU: No Dysuria, Change in Color of Urine, No Urgency or Frequency, No Flank pain.  Musculoskeletal: No Joint Pain or Swelling, No Decreased Range of Motion, +Back Pain.  Neurologic: No Syncope, No Seizures, Muscle Weakness, Paresthesia, Vision Disturbance or Loss, No Diplopia, No Vertigo, No Difficulty Walking,  Skin: No Rash or Lesions. Psych: No Change in Mood or Affect, No Depression or Anxiety, No Memory loss, No Confusion, or Hallucinations   Past  Medical History  Diagnosis Date  . Thyroid disease   . ADD (attention deficit disorder)   . Bipolar 1 disorder   . Depression   . ADD (attention deficit disorder)   . Hypothyroid   . Urinary tract infection     Past Surgical History  Procedure Laterality Date  . Mouth surgery    . Tubal ligation    . Vaginal delivery      X 6     Prior to Admission medications   Medication Sig Start Date End Date Taking? Authorizing Provider  amphetamine-dextroamphetamine (ADDERALL) 20 MG tablet Take 10 mg by mouth daily.    Yes Historical Provider, MD  levothyroxine (SYNTHROID, LEVOTHROID) 50 MCG tablet Take 50 mcg by mouth every morning.    Yes Historical Provider, MD  cephALEXin (KEFLEX) 500 MG capsule 2 caps po bid x 7 days 11/04/13   Mora Bellman, PA-C  ondansetron (ZOFRAN) 4 MG tablet Take 1 tablet (4 mg total) by mouth every 6 (six) hours. 11/04/13   Mora Bellman, PA-C  oxyCODONE-acetaminophen (PERCOCET/ROXICET) 5-325 MG per tablet Take 1 tablet by mouth every 4 (four) hours as needed for severe pain. May take 2 tablets PO q 6 hours for severe pain - Do not take with Tylenol as this tablet already contains tylenol 11/04/13   Mora Bellman, PA-C      Allergies  Allergen Reactions  . Latex Other (See Comments)    Reaction unknown  . Phenergan [Promethazine Hcl] Other (See Comments)    Restless legs     Social History:  reports that she quit smoking about 3 weeks ago. Her smoking use included Cigarettes. She smoked 0.25 packs per day. She has never used smokeless tobacco. She reports that she does not drink alcohol or use illicit drugs.     Family History  Problem Relation Age of Onset  . Cancer Other   . Heart failure Other        Physical Exam:  GEN:  Pleasant Obese  38 y.o. Caucasian female examined  and in no acute distress; cooperative with exam Filed Vitals:   11/04/13 0300 11/04/13 0315 11/04/13 0345 11/04/13 0400  BP: 87/52 90/54 92/56  97/55  Pulse: 85 83 81  83  Temp:      TempSrc:      Resp: 16 15 10 20   Height:      Weight:      SpO2: 96% 95% 97% 98%   Blood pressure 97/55, pulse 83, temperature 98.1 F (36.7 C), temperature source Oral, resp. rate 20, height 5\' 6"  (1.676 m), weight 97.268 kg (214 lb 7 oz), last menstrual period 09/02/2013, SpO2 98.00%. PSYCH: She is alert and oriented x4; does not appear anxious does not appear depressed; affect is normal HEENT: Normocephalic and Atraumatic, Mucous membranes pink; PERRLA; EOM intact; Fundi:  Benign;  No scleral icterus, Nares: Patent, Oropharynx: Clear, Fair Dentition,    Neck:  FROM, No Cervical Lymphadenopathy nor Thyromegaly or Carotid Bruit; No JVD; Breasts:: Not examined CHEST WALL: No tenderness CHEST: Normal respiration, clear to auscultation bilaterally HEART: Regular rate and rhythm; no murmurs rubs or gallops BACK: No kyphosis or scoliosis; +Left CVA tenderness ABDOMEN: Positive Bowel Sounds, Obese, Soft Non-Tender; No Masses, No Organomegaly. Rectal Exam: Not done EXTREMITIES: No Cyanosis, Clubbing, or Edema; No Ulcerations. Genitalia: not examined PULSES: 2+ and symmetric SKIN: Normal hydration no rash or ulceration CNS:  Alert and Oriented x 4, No Focal Deficits  Vascular: pulses palpable throughout    Labs on Admission:  Basic Metabolic Panel:  Recent Labs Lab 11/04/13 0015  NA 142  K 3.8  CL 103  CO2 27  GLUCOSE 87  BUN 11  CREATININE 0.87  CALCIUM 9.2   Liver Function Tests:  Recent Labs Lab 11/04/13 0015  AST 14  ALT 14  ALKPHOS 56  BILITOT 0.4  PROT 7.6  ALBUMIN 3.6   No results found for this basename: LIPASE, AMYLASE,  in the last 168 hours No results found for this basename: AMMONIA,  in the last 168 hours CBC:  Recent Labs Lab 11/04/13 0015  WBC 8.2  NEUTROABS 4.2  HGB 13.6  HCT 40.5  MCV 89.2  PLT 264   Cardiac Enzymes: No results found for this basename: CKTOTAL, CKMB, CKMBINDEX, TROPONINI,  in the last 168 hours  BNP  (last 3 results)  Recent Labs  05/27/13 1802  PROBNP 15.4   CBG: No results found for this basename: GLUCAP,  in the last 168 hours  Radiological Exams on Admission: Ct Abdomen Pelvis Wo Contrast  11/04/2013   CLINICAL DATA:  Back pain radiating to the lower abdomen. Foul odor to the urine.  EXAM: CT ABDOMEN AND PELVIS WITHOUT CONTRAST  TECHNIQUE: Multidetector CT imaging of the abdomen and pelvis was performed following the standard protocol without IV contrast.  COMPARISON:  02/01/2012  FINDINGS: Slight fibrosis or linear atelectasis in the lung bases.  Kidneys appear symmetrical in size and shape. No hydronephrosis or hydroureter. No renal, ureteral, or bladder stones. Bladder is decompressed limiting assessment for bladder wall thickening.  The unenhanced appearance of the liver, spleen, pancreas, adrenal glands, abdominal aorta, inferior vena cava, and retroperitoneal lymph nodes is unremarkable. Stomach and small bowel are decompressed. Scattered stool in the colon without distention. No free air or free fluid in the abdomen. Minimal fat in the umbilical canal.  Pelvis: Appendix is normal. No evidence of diverticulitis. Uterus and ovaries are not enlarged. No free or loculated pelvic fluid collections. No pelvic mass or lymphadenopathy. No destructive bone lesions. Normal alignment of the lumbar spine. No displaced fractures identified.  IMPRESSION: No renal or ureteral stone or obstruction. No acute process demonstrated in the abdomen or pelvis on noncontrast imaging.   Electronically Signed   By: Burman NievesWilliam  Stevens M.D.   On: 11/04/2013 01:40      Assessment/Plan:   38 y.o. female with   Principal Problem:      1.   Sepsis/Pyelonephritis/UTI (lower urinary tract infection)   Urine C+S sent   Lactic Acid Sent   IV Rocephin    2.   Hypotension   IVFs    3.   DVT Prophylaxis   Lovenox     Code Status:    FULL CODE Family Communication:    No Family Present Disposition Plan:       Observation  Time spent: 260 Minutes  Ron ParkerJENKINS,Branden Vine C [facility] Pager [phone]   If 7AM -7PM Please Contact the Day Rounding Team MD for [facility]  If 7PM-7AM, Please Contact Night-Floor Coverage  [email or web address] Password [email or web address] 11/04/2013, 4:55 AM

## 2013-11-04 NOTE — Progress Notes (Signed)
Patient seen and evaluated earlier this AM by my associate. Please refer to H and P for details.  BP's soft despite 3.75 liters of fluids administered.  Will transfuse another 500 cc of normal saline. Agree with holding IV opiods while blood pressures soft, discussed with nursing.  Will reassess next am.  [name], Bryana Froemming

## 2013-11-04 NOTE — ED Notes (Signed)
Still not able to give Pt. Dilaudid due to SBP in the 90's. PA made aware new orders received.

## 2013-11-05 DIAGNOSIS — R109 Unspecified abdominal pain: Secondary | ICD-10-CM

## 2013-11-05 NOTE — Progress Notes (Signed)
[facility] was notified of rash on pt face and chest area and that headache not relieved with NAIDS and bp was 97/51 benadryl was ordered for that rash and that OXiIR can be used for headaches will continue to monitor. Ilean SkillVeronica Zyairah Wacha LPN

## 2013-11-05 NOTE — Progress Notes (Signed)
[facility] PROGRESS NOTE  [name] NWG:[id number] DOB: [birth year] DOA: 11/03/2013 PCP: Pcp Not In System  Assessment/Plan: Principal Problem:   Sepsis/Pyelonephritis -Continue IV antibiotics specifically ceftriaxone - Urine cultures pending but growing more than 100,000 colony-forming units of Escherichia coli. Sensitivities pending  Active Problems:   UTI (lower urinary tract infection) -As listed above    Hypotension -Resolved after IV fluid rehydration and improved oral intake - As well as on IV antibiotics   Code Status:full Family Communication: No family at bedside Disposition Plan: Will transition to oral antibiotic regimen to once patient has been afebrile for 24-48 hours   Consultants:  None  Procedures:  None  Antibiotics:  Rocephin  HPI/Subjective: Patient has no new complaints.  Objective: Filed Vitals:   11/05/13 1500  BP: 108/59  Pulse: 74  Temp: 99.2 F (37.3 C)  Resp: 18    Intake/Output Summary (Last 24 hours) at 11/05/13 1712 Last data filed at 11/05/13 1616  Gross per 24 hour  Intake 2811.67 ml  Output    500 ml  Net 2311.67 ml   Filed Weights   11/03/13 2330  Weight: 97.268 kg (214 lb 7 oz)    Exam:   General:  Patient in no acute distress, alert and awake  Cardiovascular: Regular rate and rhythm, no murmurs rubs  Respiratory: Clear to auscultation bilaterally no wheezes  Abdomen: Soft, nontender, nondistended  Musculoskeletal: No cyanosis or clubbing   Data Reviewed: Basic Metabolic Panel:  Recent Labs Lab 11/04/13 0015 11/04/13 0805  NA 142 144  K 3.8 4.0  CL 103 113*  CO2 27 22  GLUCOSE 87 81  BUN 11 11  CREATININE 0.87 0.77  CALCIUM 9.2 7.3*   Liver Function Tests:  Recent Labs Lab 11/04/13 0015  AST 14  ALT 14  ALKPHOS 56  BILITOT 0.4  PROT 7.6  ALBUMIN 3.6   No results found for this basename: LIPASE, AMYLASE,  in the last 168 hours No results found for this basename: AMMONIA,   in the last 168 hours CBC:  Recent Labs Lab 11/04/13 0015 11/04/13 0805  WBC 8.2 5.9  NEUTROABS 4.2  --   HGB 13.6 11.8*  HCT 40.5 34.6*  MCV 89.2 91.3  PLT 264 214   Cardiac Enzymes: No results found for this basename: CKTOTAL, CKMB, CKMBINDEX, TROPONINI,  in the last 168 hours BNP (last 3 results)  Recent Labs  05/27/13 1802  PROBNP 15.4   CBG: No results found for this basename: GLUCAP,  in the last 168 hours  Recent Results (from the past 240 hour(s))  URINE CULTURE     Status: None   Collection Time    11/03/13 11:42 PM      Result Value Ref Range Status   Specimen Description URINE, CATHETERIZED   Final   Special Requests NONE   Final   Culture  Setup Time     Final   Value: 11/04/2013 05:57     Performed at [facility] Count     Final   Value: >=100,000 COLONIES/ML     Performed at [facility]   Culture     Final   Value: ESCHERICHIA COLI     Performed at [facility]   Report Status PENDING   Incomplete     Studies: Ct Abdomen Pelvis Wo Contrast  11/04/2013   CLINICAL DATA:  Back pain radiating to the lower abdomen. Foul odor to the urine.  EXAM: CT ABDOMEN  AND PELVIS WITHOUT CONTRAST  TECHNIQUE: Multidetector CT imaging of the abdomen and pelvis was performed following the standard protocol without IV contrast.  COMPARISON:  02/01/2012  FINDINGS: Slight fibrosis or linear atelectasis in the lung bases.  Kidneys appear symmetrical in size and shape. No hydronephrosis or hydroureter. No renal, ureteral, or bladder stones. Bladder is decompressed limiting assessment for bladder wall thickening.  The unenhanced appearance of the liver, spleen, pancreas, adrenal glands, abdominal aorta, inferior vena cava, and retroperitoneal lymph nodes is unremarkable. Stomach and small bowel are decompressed. Scattered stool in the colon without distention. No free air or free fluid in the abdomen. Minimal fat in the umbilical canal.  Pelvis:  Appendix is normal. No evidence of diverticulitis. Uterus and ovaries are not enlarged. No free or loculated pelvic fluid collections. No pelvic mass or lymphadenopathy. No destructive bone lesions. Normal alignment of the lumbar spine. No displaced fractures identified.  IMPRESSION: No renal or ureteral stone or obstruction. No acute process demonstrated in the abdomen or pelvis on noncontrast imaging.   Electronically Signed   By: Burman NievesWilliam  Stevens M.D.   On: 11/04/2013 01:40    Scheduled Meds: . amphetamine-dextroamphetamine  10 mg Oral Daily  . cefTRIAXone (ROCEPHIN)  IV  1 g Intravenous Q24H  . enoxaparin (LOVENOX) injection  40 mg Subcutaneous Q24H  . levothyroxine  50 mcg Oral QAC breakfast   Continuous Infusions: . sodium chloride 100 mL/hr at 11/05/13 1655    Time spent: > 35 minutes    [name], [name]  [facility] Pager [phone]  If 7PM-7AM, please contact night-coverage at [email or web address], password [email or web address] 11/05/2013, 5:12 PM  LOS: 2 days

## 2013-11-06 LAB — URINE CULTURE: Colony Count: >=100000

## 2013-11-06 MED ORDER — CIPROFLOXACIN HCL 500 MG PO TABS
500.0000 mg | ORAL_TABLET | Freq: Two times a day (BID) | ORAL | Status: DC
Start: 2013-11-06 — End: 2016-06-17

## 2013-11-06 MED ORDER — ONDANSETRON HCL 4 MG PO TABS: 4.0000 mg | ORAL_TABLET | Freq: Four times a day (QID) | ORAL | Status: DC

## 2013-11-06 NOTE — Care Management Note (Signed)
    Page 1 of 1   11/06/2013     12:01:49 PM CARE MANAGEMENT NOTE 11/06/2013  Patient:  [name],[name]   Account Number:  [id number]  Date Initiated:  11/06/2013  Documentation initiated by:  Letha CapeAYLOR,Mckinna Demars  Subjective/Objective Assessment:   dx pyelonephritis  admit- lives with children. pta indep.     Action/Plan:   Anticipated DC Date:  11/06/2013   Anticipated DC Plan:  HOME/SELF CARE      DC Planning Services  CM consult      Choice offered to / List presented to:             Status of service:  Completed, signed off Medicare Important Message given?  NO (If response is "NO", the following Medicare IM given date fields will be blank) Date Medicare IM given:   Medicare IM given by:   Date Additional Medicare IM given:   Additional Medicare IM given by:    Discharge Disposition:  HOME/SELF CARE  Per UR Regulation:  Reviewed for med. necessity/level of care/duration of stay  If discussed at Long Length of Stay Meetings, dates discussed:    Comments:  11/06/13 1200 Letha Capeeborah Jahlon Baines RN, BSN [phone] patient lives with children, patient for dc today, NCM spoke with patient , she states she will be getting her own PCP next week, she has in mind who she would like to go see.  NCM informed MD.

## 2013-11-06 NOTE — Progress Notes (Signed)
NURSING PROGRESS NOTE  Loetta Roughmily [place] [id number] Discharge Data: 11/06/2013 11:49 AM Attending Provider: Penny Piarlando Vega, MD PCP:Pcp Not In System     Loetta RoughEmily Beldin to be D/C'd Home per MD order.  Discussed with the patient the After Visit Summary and all questions fully answered. All IV's discontinued with no bleeding noted. All belongings returned to patient for patient to take home.   Last Vital Signs:  Blood pressure 93/53, pulse 76, temperature 98.1 F (36.7 C), temperature source Oral, resp. rate 18, height 5\' 6"  (1.676 m), weight 97.268 kg (214 lb 7 oz), last menstrual period 09/02/2013, SpO2 98.00%.  Discharge Medication List   Medication List         amphetamine-dextroamphetamine 20 MG tablet  Commonly known as:  ADDERALL  Take 10 mg by mouth daily.     ciprofloxacin 500 MG tablet  Commonly known as:  CIPRO  Take 1 tablet (500 mg total) by mouth 2 (two) times daily.     levothyroxine 50 MCG tablet  Commonly known as:  SYNTHROID, LEVOTHROID  Take 50 mcg by mouth every morning.     ondansetron 4 MG tablet  Commonly known as:  ZOFRAN  Take 1 tablet (4 mg total) by mouth every 6 (six) hours.         Cathlyn Parsonsattha Sherlin Sonier, RN

## 2013-11-06 NOTE — Discharge Summary (Signed)
Physician Discharge Summary  [name] WJX:[id number] DOB: 1975/10/14 DOA: 11/03/2013  PCP: Pcp Not In System  Admit date: 11/03/2013 Discharge date: 11/06/2013  Time spent: > 35 minutes  Recommendations for Outpatient Follow-up:  1) please followup with your primary care physician within the next one to 2 weeks or sooner should any new concerns arise 2) we'll plan on discharging on ciprofloxacin for 4 more days to complete a seven-day course  Discharge Diagnoses: Please see list below  Discharge Condition: Stable  Diet recommendation: Regular  Filed Weights   11/03/13 2330  Weight: 97.268 kg (214 lb 7 oz)    History of present illness:  38 year old who presented with hypotension and sepsis secondary to urinary tract source.  Hospital Course:   Principal Problem:  Sepsis/Pyelonephritis  -Patient placed on third generation cephalosporin IV initially - Urine cultures pending but growing more than 100,000 colony-forming units of Escherichia coli. Pansensitive - As listed above we'll continue Cipro as outpatient to complete a seven-day total treatment course  Active Problems:  UTI (lower urinary tract infection)  -Grew out Escherichia coli which was pansensitive.  Hypotension  -Resolved with IV fluids and IV antibiotics. Sensitivities reviewed and Escherichia coli sensitive to Cipro - Patient had documented low blood pressure within last 24 hours. But upon review of chart the blood pressure was repeated without any intervention and was found to be higher. As such I suspect that this was a blood pressure reading error. Discussed with patient denies any sensation of passing out or any other complaints around the time of documented low blood pressure.  Hypothyroidism -Stable continue home Synthroid regimen  Procedures:  CT of abdomen and pelvis please see report below or in EMR  Consultations:  None  Discharge Exam: Filed Vitals:   11/06/13 0811  BP: 109/58  Pulse: 71   Temp: 98.2 F (36.8 C)  Resp: 16    General: Pt in nad, alert and awake Cardiovascular: no cyanosis, warm extremities Respiratory: cta bl, no wheezes  Discharge Instructions You were cared for by a hospitalist during your hospital stay. If you have any questions about your discharge medications or the care you received while you were in the hospital after you are discharged, you can call the unit and asked to speak with the hospitalist on call if the hospitalist that took care of you is not available. Once you are discharged, your primary care physician will handle any further medical issues. Please note that NO REFILLS for any discharge medications will be authorized once you are discharged, as it is imperative that you return to your primary care physician (or establish a relationship with a primary care physician if you do not have one) for your aftercare needs so that they can reassess your need for medications and monitor your lab values.  Discharge Instructions   Call MD for:  extreme fatigue    Complete by:  As directed      Call MD for:  persistant nausea and vomiting    Complete by:  As directed      Call MD for:  severe uncontrolled pain    Complete by:  As directed      Call MD for:  temperature >100.4    Complete by:  As directed      Diet - low sodium heart healthy    Complete by:  As directed      Increase activity slowly    Complete by:  As directed  Current Discharge Medication List    START taking these medications   Details  ciprofloxacin (CIPRO) 500 MG tablet Take 1 tablet (500 mg total) by mouth 2 (two) times daily. Qty: 8 tablet, Refills: 0    ondansetron (ZOFRAN) 4 MG tablet Take 1 tablet (4 mg total) by mouth every 6 (six) hours. Qty: 12 tablet, Refills: 0      CONTINUE these medications which have NOT CHANGED   Details  amphetamine-dextroamphetamine (ADDERALL) 20 MG tablet Take 10 mg by mouth daily.     levothyroxine (SYNTHROID,  LEVOTHROID) 50 MCG tablet Take 50 mcg by mouth every morning.        Allergies  Allergen Reactions  . Latex Other (See Comments)    Reaction unknown  . Phenergan [Promethazine Hcl] Other (See Comments)    Restless legs   Follow-up Information   Follow up with your doctor.       The results of significant diagnostics from this hospitalization (including imaging, microbiology, ancillary and laboratory) are listed below for reference.    Significant Diagnostic Studies: Ct Abdomen Pelvis Wo Contrast  11/04/2013   CLINICAL DATA:  Back pain radiating to the lower abdomen. Foul odor to the urine.  EXAM: CT ABDOMEN AND PELVIS WITHOUT CONTRAST  TECHNIQUE: Multidetector CT imaging of the abdomen and pelvis was performed following the standard protocol without IV contrast.  COMPARISON:  02/01/2012  FINDINGS: Slight fibrosis or linear atelectasis in the lung bases.  Kidneys appear symmetrical in size and shape. No hydronephrosis or hydroureter. No renal, ureteral, or bladder stones. Bladder is decompressed limiting assessment for bladder wall thickening.  The unenhanced appearance of the liver, spleen, pancreas, adrenal glands, abdominal aorta, inferior vena cava, and retroperitoneal lymph nodes is unremarkable. Stomach and small bowel are decompressed. Scattered stool in the colon without distention. No free air or free fluid in the abdomen. Minimal fat in the umbilical canal.  Pelvis: Appendix is normal. No evidence of diverticulitis. Uterus and ovaries are not enlarged. No free or loculated pelvic fluid collections. No pelvic mass or lymphadenopathy. No destructive bone lesions. Normal alignment of the lumbar spine. No displaced fractures identified.  IMPRESSION: No renal or ureteral stone or obstruction. No acute process demonstrated in the abdomen or pelvis on noncontrast imaging.   Electronically Signed   By: Burman Nieves M.D.   On: 11/04/2013 01:40    Microbiology: Recent Results (from the  past 240 hour(s))  URINE CULTURE     Status: None   Collection Time    11/03/13 11:42 PM      Result Value Ref Range Status   Specimen Description URINE, CATHETERIZED   Final   Special Requests NONE   Final   Culture  Setup Time     Final   Value: 11/04/2013 05:57     Performed at [facility]   Colony Count     Final   Value: >=100,000 COLONIES/ML     Performed at [facility]   Culture     Final   Value: ESCHERICHIA COLI     Performed at [facility]   Report Status 11/06/2013 FINAL   Final   Organism ID, Bacteria ESCHERICHIA COLI   Final     Labs: Basic Metabolic Panel:  Recent Labs Lab 11/04/13 0015 11/04/13 0805  NA 142 144  K 3.8 4.0  CL 103 113*  CO2 27 22  GLUCOSE 87 81  BUN 11 11  CREATININE 0.87 0.77  CALCIUM 9.2 7.3*  Liver Function Tests:  Recent Labs Lab 11/04/13 0015  AST 14  ALT 14  ALKPHOS 56  BILITOT 0.4  PROT 7.6  ALBUMIN 3.6   No results found for this basename: LIPASE, AMYLASE,  in the last 168 hours No results found for this basename: AMMONIA,  in the last 168 hours CBC:  Recent Labs Lab 11/04/13 0015 11/04/13 0805  WBC 8.2 5.9  NEUTROABS 4.2  --   HGB 13.6 11.8*  HCT 40.5 34.6*  MCV 89.2 91.3  PLT 264 214   Cardiac Enzymes: No results found for this basename: CKTOTAL, CKMB, CKMBINDEX, TROPONINI,  in the last 168 hours BNP: BNP (last 3 results)  Recent Labs  05/27/13 1802  PROBNP 15.4   CBG: No results found for this basename: GLUCAP,  in the last 168 hours     Signed:  [name], Amilah Greenspan  [facility] 11/06/2013, 10:55 AM

## 2013-11-07 NOTE — ED Provider Notes (Signed)
Medical screening examination/treatment/procedure(s) were performed by non-physician practitioner and as supervising physician I was immediately available for consultation/collaboration.   EKG Interpretation None        Daizy Outen, MD 11/07/13 0022 

## 2013-11-08 ENCOUNTER — Encounter (HOSPITAL_COMMUNITY): Payer: Self-pay | Admitting: Emergency Medicine

## 2013-11-08 ENCOUNTER — Emergency Department (HOSPITAL_COMMUNITY)
Admission: EM | Admit: 2013-11-08 | Discharge: 2013-11-09 | Disposition: A | Discharge status: Home / Self Care | Payer: 59 | Attending: Emergency Medicine | Admitting: Emergency Medicine

## 2013-11-08 DIAGNOSIS — R1032 Left lower quadrant pain: Secondary | ICD-10-CM | POA: Diagnosis not present

## 2013-11-08 DIAGNOSIS — Z792 Long term (current) use of antibiotics: Secondary | ICD-10-CM | POA: Insufficient documentation

## 2013-11-08 DIAGNOSIS — R109 Unspecified abdominal pain: Secondary | ICD-10-CM | POA: Diagnosis present

## 2013-11-08 DIAGNOSIS — Z3202 Encounter for pregnancy test, result negative: Secondary | ICD-10-CM | POA: Insufficient documentation

## 2013-11-08 DIAGNOSIS — Z87440 Personal history of urinary (tract) infections: Secondary | ICD-10-CM | POA: Diagnosis not present

## 2013-11-08 DIAGNOSIS — Z79899 Other long term (current) drug therapy: Secondary | ICD-10-CM | POA: Insufficient documentation

## 2013-11-08 DIAGNOSIS — Z87891 Personal history of nicotine dependence: Secondary | ICD-10-CM | POA: Diagnosis not present

## 2013-11-08 DIAGNOSIS — F909 Attention-deficit hyperactivity disorder, unspecified type: Secondary | ICD-10-CM | POA: Insufficient documentation

## 2013-11-08 DIAGNOSIS — E039 Hypothyroidism, unspecified: Secondary | ICD-10-CM | POA: Insufficient documentation

## 2013-11-08 DIAGNOSIS — Z91040 Latex allergy status: Secondary | ICD-10-CM | POA: Insufficient documentation

## 2013-11-08 DIAGNOSIS — F319 Bipolar disorder, unspecified: Secondary | ICD-10-CM | POA: Diagnosis not present

## 2013-11-08 LAB — CBC WITH DIFFERENTIAL/PLATELET
BASOS ABS: 0 10*3/uL (ref 0.0–0.1)
Basophils Relative: 0 % (ref 0–1)
Eosinophils Absolute: 0.1 10*3/uL (ref 0.0–0.7)
Eosinophils Relative: 1 % (ref 0–5)
HCT: 39.5 % (ref 36.0–46.0)
Hemoglobin: 13.8 g/dL (ref 12.0–15.0)
LYMPHS ABS: 2.3 10*3/uL (ref 0.7–4.0)
Lymphocytes Relative: 28 % (ref 12–46)
MCH: 30.7 pg (ref 26.0–34.0)
MCHC: 34.9 g/dL (ref 30.0–36.0)
MCV: 88 fL (ref 78.0–100.0)
MONO ABS: 0.5 10*3/uL (ref 0.1–1.0)
Monocytes Relative: 6 % (ref 3–12)
Neutro Abs: 5.2 10*3/uL (ref 1.7–7.7)
Neutrophils Relative %: 65 % (ref 43–77)
Platelets: 240 10*3/uL (ref 150–400)
RBC: 4.49 MIL/uL (ref 3.87–5.11)
RDW: 12.5 % (ref 11.5–15.5)
WBC: 8.1 10*3/uL (ref 4.0–10.5)

## 2013-11-08 LAB — LIPASE, BLOOD: LIPASE: 20 U/L (ref 11–59)

## 2013-11-08 LAB — COMPREHENSIVE METABOLIC PANEL
ALT: 31 U/L (ref 0–35)
ANION GAP: 12 (ref 5–15)
AST: 26 U/L (ref 0–37)
Albumin: 3.8 g/dL (ref 3.5–5.2)
Alkaline Phosphatase: 52 U/L (ref 39–117)
BUN: 14 mg/dL (ref 6–23)
CALCIUM: 9.1 mg/dL (ref 8.4–10.5)
CO2: 26 meq/L (ref 19–32)
Chloride: 101 mEq/L (ref 96–112)
Creatinine, Ser: 0.72 mg/dL (ref 0.50–1.10)
GLUCOSE: 90 mg/dL (ref 70–99)
Potassium: 3.7 mEq/L (ref 3.7–5.3)
Sodium: 139 mEq/L (ref 137–147)
Total Bilirubin: <0.2 mg/dL — ABNORMAL LOW (ref 0.3–1.2)
Total Protein: 7.5 g/dL (ref 6.0–8.3)

## 2013-11-08 LAB — URINALYSIS, ROUTINE W REFLEX MICROSCOPIC
Bilirubin Urine: NEGATIVE
GLUCOSE, UA: NEGATIVE mg/dL
Ketones, ur: NEGATIVE mg/dL
Nitrite: NEGATIVE
Protein, ur: NEGATIVE mg/dL
SPECIFIC GRAVITY, URINE: 1.019 (ref 1.005–1.030)
Urobilinogen, UA: 0.2 mg/dL (ref 0.0–1.0)
pH: 6.5 (ref 5.0–8.0)

## 2013-11-08 LAB — URINE MICROSCOPIC-ADD ON

## 2013-11-08 LAB — LACTIC ACID, PLASMA: LACTIC ACID, VENOUS: 0.6 mmol/L (ref 0.5–2.2)

## 2013-11-08 LAB — PREGNANCY, URINE: PREG TEST UR: NEGATIVE

## 2013-11-08 MED ORDER — ONDANSETRON HCL 4 MG/2ML IJ SOLN
4.0000 mg | Freq: Once | INTRAMUSCULAR | Status: AC
  Administered 2013-11-08: 4 mg via INTRAVENOUS
  Filled 2013-11-08: qty 2

## 2013-11-08 MED ORDER — HYDROMORPHONE HCL 1 MG/ML IJ SOLN
0.5000 mg | Freq: Once | INTRAMUSCULAR | Status: AC
  Administered 2013-11-08: 0.5 mg via INTRAVENOUS
  Filled 2013-11-08: qty 1

## 2013-11-08 MED ORDER — HYDROMORPHONE HCL 2 MG PO TABS: 2.0000 mg | ORAL_TABLET | Freq: Four times a day (QID) | ORAL | Status: DC | PRN

## 2013-11-08 MED ORDER — PHENAZOPYRIDINE HCL 100 MG PO TABS
200.0000 mg | ORAL_TABLET | Freq: Once | ORAL | Status: AC
  Administered 2013-11-08: 200 mg via ORAL
  Filled 2013-11-08: qty 2

## 2013-11-08 MED ORDER — SODIUM CHLORIDE 0.9 % IV BOLUS (SEPSIS)
1000.0000 mL | Freq: Once | INTRAVENOUS | Status: AC
  Administered 2013-11-08: 1000 mL via INTRAVENOUS

## 2013-11-08 MED ORDER — OXYCODONE-ACETAMINOPHEN 5-325 MG PO TABS: 1.0000 | ORAL_TABLET | ORAL | Status: DC | PRN

## 2013-11-08 MED ORDER — PHENAZOPYRIDINE HCL 200 MG PO TABS: 200.0000 mg | ORAL_TABLET | Freq: Three times a day (TID) | ORAL | Status: DC

## 2013-11-08 NOTE — ED Notes (Addendum)
C/o LLQ pain, L side and L lower back. Mentions recent admission for the same, "here for the same, taking abx, but not getting better, getting worse". Also mentions confusion, forgetfulness. Also HA "same since I left". Concerned for sepsis because she saw it in her medical records/ my chart". Lab work noted from 9/30.

## 2013-11-08 NOTE — ED Provider Notes (Signed)
CSN: [id number]     Arrival date & time 11/08/13  1936 History   First MD Initiated Contact with Patient 11/08/13 1954     Chief Complaint  Patient presents with  . Abdominal Pain   Patient is a 38 y.o. female presenting with abdominal pain. The history is provided by the patient.  Abdominal Pain Pain location:  L flank and LLQ Pain quality: cramping   Pain severity:  Severe Onset quality:  Gradual Duration:  1 week Timing:  Constant Progression:  Worsening Chronicity:  Recurrent Relieved by:  Nothing Ineffective treatments: antibiotics. Associated symptoms: dysuria   Associated symptoms: no chest pain, no constipation, no diarrhea, no fever, no hematochezia, no hematuria, no melena, no nausea, no shortness of breath, no sore throat and no vomiting   Risk factors: recent hospitalization    Patient is a 6 her old Caucasian female who was recently discharged after an admission for urosepsis she received IV antibiotics and was discharged on Cipro. Patient states that since her discharge she has continued to have left flank pain. She began having dysuria today but denies hematuria. She denies vaginal bleeding, discharge or itching. Patient states that she has had 2 cousins that died of sepsis and was just made aware that she was diagnosed with sepsis and an Escherichia coli urine infection during her last admission. Patient also states that she has felt confused over the last couple of days. Patient states that a coworker took her blood pressure at work and was noted to have a systolic pressure in the 70s. Past Medical History  Diagnosis Date  . Thyroid disease   . ADD (attention deficit disorder)   . Bipolar 1 disorder   . Depression   . ADD (attention deficit disorder)   . Hypothyroid   . Urinary tract infection    Past Surgical History  Procedure Laterality Date  . Mouth surgery    . Tubal ligation    . Vaginal delivery      X 6   Family History  Problem Relation Age of Onset   . Cancer Other   . Heart failure Other    History  Substance Use Topics  . Smoking status: Former Smoker -- 0.25 packs/day    Types: Cigarettes    Quit date: 10/13/2013  . Smokeless tobacco: Never Used  . Alcohol Use: No   OB History   Grav Para Term Preterm Abortions TAB SAB Ect Mult Living   7 6 6  1  1   6      Review of Systems  Constitutional: Negative for fever.  HENT: Negative for rhinorrhea and sore throat.   Eyes: Negative for visual disturbance.  Respiratory: Negative for chest tightness and shortness of breath.   Cardiovascular: Negative for chest pain and palpitations.  Gastrointestinal: Positive for abdominal pain. Negative for nausea, vomiting, diarrhea, constipation, melena and hematochezia.  Genitourinary: Positive for dysuria and flank pain. Negative for hematuria.  Musculoskeletal: Negative for back pain and neck pain.  Skin: Negative for rash.  Neurological: Negative for dizziness and headaches.  Psychiatric/Behavioral: Negative for confusion.  All other systems reviewed and are negative.  Allergies  Latex and Phenergan  Home Medications   Prior to Admission medications   Medication Sig Start Date End Date Taking? Authorizing Provider  amphetamine-dextroamphetamine (ADDERALL) 20 MG tablet Take 10 mg by mouth daily.     Historical Provider, MD  ciprofloxacin (CIPRO) 500 MG tablet Take 1 tablet (500 mg total) by mouth 2 (two) times  daily. 11/06/13   Penny Piarlando Vega, MD  levothyroxine (SYNTHROID, LEVOTHROID) 50 MCG tablet Take 50 mcg by mouth every morning.     Historical Provider, MD  ondansetron (ZOFRAN) 4 MG tablet Take 1 tablet (4 mg total) by mouth every 6 (six) hours. 11/06/13   Penny Piarlando Vega, MD   BP 123/59  Pulse 115  Temp(Src) 98.4 F (36.9 C) (Oral)  Resp 14  SpO2 94%  LMP 11/03/2013 Physical Exam  Constitutional: She is oriented to person, place, and time. She appears well-developed and well-nourished. No distress.  HENT:  Head: Normocephalic  and atraumatic.  Mouth/Throat: Oropharynx is clear and moist.  Eyes: EOM are normal. Pupils are equal, round, and reactive to light.  Neck: Neck supple. No JVD present.  Cardiovascular: Normal rate, regular rhythm, normal heart sounds and intact distal pulses.  Exam reveals no gallop.   No murmur heard. Pulmonary/Chest: Effort normal and breath sounds normal. She has no wheezes. She has no rales.  Abdominal: Soft. She exhibits no distension. There is tenderness in the left lower quadrant. There is CVA tenderness (left). There is no rigidity, no rebound and no guarding.  Musculoskeletal: Normal range of motion. She exhibits no tenderness.  Neurological: She is alert and oriented to person, place, and time. No cranial nerve deficit. She exhibits normal muscle tone.  Skin: Skin is warm and dry. No rash noted.  Psychiatric: Her behavior is normal.   ED Course  Procedures  None  Final diagnoses:  Left flank pain   38 year old female with history of UTI presents with left flank pain and episodes of confusion. Patient is well appearing on exam. She is afebrile, tachycardic to the 115, normotensive. While examining her her heart rate was in the 90s on the monitor. She has suprapubic and left lower quadrant tenderness but no peritonitis. She does not have a Murphy sign or McBurney's point tenderness. Suspect this could be failure of outpatient management of her UTI. She does not also have pyelonephritis. Will check CBC, CMP, lipase and urinalysis. Will give morphine and Zofran for symptomatic relief as well as normal saline bolus. Anticipate discharge home with continued outpatient management. However the she is still infected, will plan for admission for failure of outpatient management. UA neg for UTI. Remainder of the labs unremarkable. Patient continues to have pain suprapubically in her left flank. Patient has not been sexually active in 9 months and has had no vaginal bleeding or discharge. Doubt  pelvic pathology. Patient given Pyridium for bladder spasms. Differential includes interstitial cystitis. Patient instructed to monitor symptoms and follow up with PCP within one week. Work note given and patient instructed to minimize activity and stress. Feel that anxiety and stress are exacerbating her symptoms. Patient reports itching to several oral narcotics even with benadryl. Rx for po dilaudid given in addition to Rx for pyridium. Patient is agreeable with this plan and is stable for discharge.   Case discussed with Dr. Hyacinth MeekerMiller.    Maris BergerJonah Makinzi Prieur, MD 11/09/13 [phone]

## 2013-11-08 NOTE — ED Notes (Signed)
Patient placed on monitoring equipment

## 2013-11-08 NOTE — ED Notes (Signed)
The pt  Reports that her pain is no better

## 2013-11-08 NOTE — ED Notes (Signed)
Unable to get to the pt since she arrived in the room .  Every other service has been there.

## 2013-11-08 NOTE — ED Notes (Signed)
The pt was seen by 2 doctors before i saw her also

## 2013-11-08 NOTE — ED Provider Notes (Signed)
Pt was recently admitted to hospital for severe urinary infection - returns with dysuria, SP and L sided pain, chills but no f/v.  On exam she has ttp in the LLQ and L mid abd.  No rebound but mild guarding.    Filed Vitals:   11/08/13 1939 11/08/13 2032  BP: 123/59 102/48  Pulse: 115 95  Temp: 98.4 F (36.9 C)   TempSrc: Oral   Resp: 14 18  SpO2: 94% 98%   Heart and lungs normal, no tachycardia on my exam, no edema and normal MM.  Check UA, does not appear septic.  I saw and evaluated the patient, reviewed the resident's note and I agree with the findings and plan.  I personally interpreted the EKG as well as the resident and agree with the interpretation on the resident's chart.  Final diagnoses:  Left flank pain    Meds given in ED:  Medications  HYDROmorphone (DILAUDID) injection 0.5 mg (0.5 mg Intravenous Given 11/08/13 2207)  ondansetron (ZOFRAN) injection 4 mg (4 mg Intravenous Given 11/08/13 2209)  sodium chloride 0.9 % bolus 1,000 mL (0 mLs Intravenous Stopped 11/08/13 2240)  phenazopyridine (PYRIDIUM) tablet 200 mg (200 mg Oral Given 11/08/13 2236)    Discharge Medication List as of 11/08/2013 10:48 PM    START taking these medications   Details  phenazopyridine (PYRIDIUM) 200 MG tablet Take 1 tablet (200 mg total) by mouth 3 (three) times daily., Starting 11/08/2013, Until Discontinued, Print    oxyCODONE-acetaminophen (PERCOCET/ROXICET) 5-325 MG per tablet Take 1 tablet by mouth every 4 (four) hours as needed for moderate pain or severe pain., Starting 11/08/2013, Until Discontinued, Print            Vida RollerBrian D Kyal Arts, MD 11/09/13 [phone]

## 2013-11-08 NOTE — ED Notes (Signed)
Pt  Has a d/c but the doctor has not talked to her.  She is waiting for the ed res to  Return and talk to her.  She has been asking for those 10 minute heating packs because she reports that the pain med did not help  Her pain and she still has severe pain

## 2013-11-08 NOTE — ED Notes (Signed)
Lab back at the bedside.  

## 2013-11-09 MED ORDER — ONDANSETRON 4 MG PO TBDP: 4.0000 mg | ORAL_TABLET | Freq: Three times a day (TID) | ORAL | Status: DC | PRN

## 2013-11-09 MED ORDER — HYDROMORPHONE HCL 2 MG PO TABS: 2.0000 mg | ORAL_TABLET | ORAL | Status: DC | PRN

## 2013-11-09 NOTE — ED Provider Notes (Signed)
I saw and evaluated the patient, reviewed the resident's note and I agree with the findings and plan.  Please see my separate note regarding my evaluation of the patient.    Vida RollerBrian D Jermiah Howton, MD 11/09/13 [phone]

## 2013-11-09 NOTE — ED Notes (Signed)
Pt   Still having pain.  The ed res has been back into [place] with the pt

## 2013-11-10 LAB — URINE CULTURE: Colony Count: 40000

## 2013-12-07 ENCOUNTER — Encounter (HOSPITAL_COMMUNITY): Payer: Self-pay | Admitting: Emergency Medicine

## 2014-10-23 ENCOUNTER — Emergency Department (HOSPITAL_COMMUNITY)
Admission: EM | Admit: 2014-10-23 | Discharge: 2014-10-23 | Disposition: A | Discharge status: Home / Self Care | Payer: 59 | Attending: Emergency Medicine | Admitting: Emergency Medicine

## 2014-10-23 ENCOUNTER — Encounter (HOSPITAL_COMMUNITY): Payer: Self-pay | Admitting: *Deleted

## 2014-10-23 ENCOUNTER — Emergency Department (HOSPITAL_COMMUNITY): Payer: 59

## 2014-10-23 DIAGNOSIS — Z87891 Personal history of nicotine dependence: Secondary | ICD-10-CM | POA: Diagnosis not present

## 2014-10-23 DIAGNOSIS — R109 Unspecified abdominal pain: Secondary | ICD-10-CM | POA: Diagnosis present

## 2014-10-23 DIAGNOSIS — Z79899 Other long term (current) drug therapy: Secondary | ICD-10-CM | POA: Diagnosis not present

## 2014-10-23 DIAGNOSIS — E039 Hypothyroidism, unspecified: Secondary | ICD-10-CM | POA: Diagnosis not present

## 2014-10-23 DIAGNOSIS — Z91040 Latex allergy status: Secondary | ICD-10-CM | POA: Insufficient documentation

## 2014-10-23 DIAGNOSIS — F319 Bipolar disorder, unspecified: Secondary | ICD-10-CM | POA: Insufficient documentation

## 2014-10-23 DIAGNOSIS — E079 Disorder of thyroid, unspecified: Secondary | ICD-10-CM | POA: Insufficient documentation

## 2014-10-23 DIAGNOSIS — N390 Urinary tract infection, site not specified: Secondary | ICD-10-CM | POA: Diagnosis not present

## 2014-10-23 DIAGNOSIS — Z3202 Encounter for pregnancy test, result negative: Secondary | ICD-10-CM | POA: Diagnosis not present

## 2014-10-23 DIAGNOSIS — N938 Other specified abnormal uterine and vaginal bleeding: Secondary | ICD-10-CM | POA: Diagnosis not present

## 2014-10-23 LAB — URINALYSIS, ROUTINE W REFLEX MICROSCOPIC
Glucose, UA: NEGATIVE mg/dL
Ketones, ur: 15 mg/dL — AB
Nitrite: POSITIVE — AB
PROTEIN: 30 mg/dL — AB
Specific Gravity, Urine: 1.011 (ref 1.005–1.030)
UROBILINOGEN UA: 1 mg/dL (ref 0.0–1.0)
pH: 7.5 (ref 5.0–8.0)

## 2014-10-23 LAB — CBC WITH DIFFERENTIAL/PLATELET
BASOS PCT: 0 %
Basophils Absolute: 0 10*3/uL (ref 0.0–0.1)
EOS ABS: 0.1 10*3/uL (ref 0.0–0.7)
EOS PCT: 2 %
HCT: 39.5 % (ref 36.0–46.0)
Hemoglobin: 13.4 g/dL (ref 12.0–15.0)
Lymphocytes Relative: 35 %
Lymphs Abs: 2.7 10*3/uL (ref 0.7–4.0)
MCH: 30.7 pg (ref 26.0–34.0)
MCHC: 33.9 g/dL (ref 30.0–36.0)
MCV: 90.6 fL (ref 78.0–100.0)
MONO ABS: 0.4 10*3/uL (ref 0.1–1.0)
MONOS PCT: 5 %
NEUTROS PCT: 58 %
Neutro Abs: 4.5 10*3/uL (ref 1.7–7.7)
PLATELETS: 241 10*3/uL (ref 150–400)
RBC: 4.36 MIL/uL (ref 3.87–5.11)
RDW: 13.3 % (ref 11.5–15.5)
WBC: 7.7 10*3/uL (ref 4.0–10.5)

## 2014-10-23 LAB — URINE MICROSCOPIC-ADD ON

## 2014-10-23 LAB — COMPREHENSIVE METABOLIC PANEL
ALBUMIN: 3.4 g/dL — AB (ref 3.5–5.0)
ALK PHOS: 46 U/L (ref 38–126)
ALT: 11 U/L — ABNORMAL LOW (ref 14–54)
ANION GAP: 9 (ref 5–15)
AST: 13 U/L — ABNORMAL LOW (ref 15–41)
BILIRUBIN TOTAL: 0.7 mg/dL (ref 0.3–1.2)
BUN: 9 mg/dL (ref 6–20)
CALCIUM: 8.3 mg/dL — AB (ref 8.9–10.3)
CO2: 22 mmol/L (ref 22–32)
Chloride: 105 mmol/L (ref 101–111)
Creatinine, Ser: 0.69 mg/dL (ref 0.44–1.00)
GFR calc Af Amer: >60 mL/min (ref >60)
GFR calc non Af Amer: >60 mL/min (ref >60)
GLUCOSE: 82 mg/dL (ref 65–99)
Potassium: 3.4 mmol/L — ABNORMAL LOW (ref 3.5–5.1)
Sodium: 136 mmol/L (ref 135–145)
TOTAL PROTEIN: 6.2 g/dL — AB (ref 6.5–8.1)

## 2014-10-23 LAB — GRAM STAIN: Special Requests: NORMAL

## 2014-10-23 LAB — I-STAT CREATININE, ED: Creatinine, Ser: 0.6 mg/dL (ref 0.44–1.00)

## 2014-10-23 LAB — I-STAT BETA HCG BLOOD, ED (MC, WL, AP ONLY)

## 2014-10-23 LAB — I-STAT CG4 LACTIC ACID, ED: Lactic Acid, Venous: 0.94 mmol/L (ref 0.5–2.0)

## 2014-10-23 LAB — LIPASE, BLOOD: Lipase: 35 U/L (ref 22–51)

## 2014-10-23 MED ORDER — HYDROMORPHONE HCL 1 MG/ML IJ SOLN
1.0000 mg | Freq: Once | INTRAMUSCULAR | Status: AC
  Administered 2014-10-23: 1 mg via INTRAVENOUS
  Filled 2014-10-23: qty 1

## 2014-10-23 MED ORDER — ONDANSETRON 4 MG PO TBDP: 4.0000 mg | ORAL_TABLET | Freq: Three times a day (TID) | ORAL | Status: DC | PRN

## 2014-10-23 MED ORDER — CEFTRIAXONE SODIUM 1 G IJ SOLR
1.0000 g | Freq: Once | INTRAMUSCULAR | Status: AC
  Administered 2014-10-23: 1 g via INTRAVENOUS
  Filled 2014-10-23: qty 10

## 2014-10-23 MED ORDER — KETOROLAC TROMETHAMINE 30 MG/ML IJ SOLN
30.0000 mg | Freq: Once | INTRAMUSCULAR | Status: DC
  Filled 2014-10-23: qty 1

## 2014-10-23 MED ORDER — ONDANSETRON HCL 4 MG/2ML IJ SOLN
4.0000 mg | Freq: Once | INTRAMUSCULAR | Status: AC
  Administered 2014-10-23: 4 mg via INTRAVENOUS
  Filled 2014-10-23: qty 2

## 2014-10-23 MED ORDER — SULFAMETHOXAZOLE-TRIMETHOPRIM 800-160 MG PO TABS: 1.0000 | ORAL_TABLET | Freq: Two times a day (BID) | ORAL | Status: AC

## 2014-10-23 MED ORDER — SODIUM CHLORIDE 0.9 % IV BOLUS (SEPSIS)
1000.0000 mL | Freq: Once | INTRAVENOUS | Status: AC
  Administered 2014-10-23: 1000 mL via INTRAVENOUS

## 2014-10-23 MED ORDER — IOHEXOL 300 MG/ML  SOLN
100.0000 mL | Freq: Once | INTRAMUSCULAR | Status: AC | PRN
  Administered 2014-10-23: 100 mL via INTRAVENOUS

## 2014-10-23 NOTE — ED Notes (Signed)
The pt retuned from c-t

## 2014-10-23 NOTE — ED Notes (Signed)
The pt reports 100 mcg of fentanyl did nothing for her pain.  She did a preg test [facility] that was pos  But the [facility] preg test was neg today

## 2014-10-23 NOTE — ED Notes (Signed)
Pt still c/opain the pain med did not help although she is not hyperventilating any longer

## 2014-10-23 NOTE — ED Notes (Signed)
Pain med given the pt reports that the pain is getting worse not better

## 2014-10-23 NOTE — ED Notes (Signed)
The pt is crying   Reporting that  Pain med neverf works for her.  She left work to come herer

## 2014-10-23 NOTE — ED Provider Notes (Signed)
CSN: [id number]     Arrival date & time 10/23/14  1544 History   First MD Initiated Contact with Patient 10/23/14 1544     Chief Complaint  Patient presents with  . Abdominal Pain   Patient is a 39 y.o. female presenting with general illness. The history is provided by the patient. No language interpreter was used.  Illness Location:  Abdomen Quality:  Pain Severity:  Moderate Onset quality:  Gradual Timing:  Constant Progression:  Worsening Chronicity:  New Context:  PMHx of bipolar disease, depression, and thyroid disorder presenting with abdominal pain. Nausea onset 3 days ago associated with decreased appetite. Generalized abdominal pain onset today worse in lower quadrants. Denies emesis, diarrhea, constipation, melena, hematochezia, hematuria, dysuria, urinary frequency, urinary urgency, or vaginal discharge. LMP July 25th. Reports initial positive pressure test followed by a negative pregnancy test at home. Began with vaginal bleeding this morning. Associated symptoms: abdominal pain, fever, nausea and vomiting   Associated symptoms: no chest pain, no congestion, no cough, no headaches, no loss of consciousness, no shortness of breath and no sore throat     Past Medical History  Diagnosis Date  . Thyroid disease   . ADD (attention deficit disorder)   . Bipolar 1 disorder   . Depression   . ADD (attention deficit disorder)   . Hypothyroid   . Urinary tract infection    Past Surgical History  Procedure Laterality Date  . Mouth surgery    . Tubal ligation    . Vaginal delivery      X 6   Family History  Problem Relation Age of Onset  . Cancer Other   . Heart failure Other    Social History  Substance Use Topics  . Smoking status: Former Smoker -- 0.25 packs/day    Types: Cigarettes    Quit date: 10/13/2013  . Smokeless tobacco: Never Used  . Alcohol Use: No   OB History    Gravida Para Term Preterm AB TAB SAB Ectopic Multiple Living   7 6 6  1  1   6        Review of Systems  Constitutional: Positive for fever and chills.  HENT: Negative for congestion and sore throat.   Respiratory: Negative for cough and shortness of breath.   Cardiovascular: Negative for chest pain.  Gastrointestinal: Positive for nausea, vomiting and abdominal pain.  Genitourinary: Positive for dysuria, frequency and vaginal bleeding. Negative for vaginal discharge.  Neurological: Negative for loss of consciousness and headaches.  All other systems reviewed and are negative.   Allergies  Latex and Phenergan  Home Medications   Prior to Admission medications   Medication Sig Start Date End Date Taking? Authorizing Provider  amphetamine-dextroamphetamine (ADDERALL) 20 MG tablet Take 10 mg by mouth daily.     Historical Provider, MD  ciprofloxacin (CIPRO) 500 MG tablet Take 1 tablet (500 mg total) by mouth 2 (two) times daily. 11/06/13   Penny Pia, MD  HYDROmorphone (DILAUDID) 2 MG tablet Take 1 tablet (2 mg total) by mouth every 4 (four) hours as needed for moderate pain or severe pain. 11/09/13   Maris Berger, MD  levothyroxine (SYNTHROID, LEVOTHROID) 50 MCG tablet Take 50 mcg by mouth every morning.     Historical Provider, MD  ondansetron (ZOFRAN ODT) 4 MG disintegrating tablet Take 1 tablet (4 mg total) by mouth every 8 (eight) hours as needed for nausea or vomiting. 10/23/14   Angelina Ok, MD  phenazopyridine (PYRIDIUM) 200 MG tablet Take  1 tablet (200 mg total) by mouth 3 (three) times daily. 11/08/13   Maris Berger, MD  sulfamethoxazole-trimethoprim (BACTRIM DS,SEPTRA DS) 800-160 MG per tablet Take 1 tablet by mouth 2 (two) times daily. 10/23/14 10/30/14  Angelina Ok, MD   BP 108/62 mmHg  Pulse 87  Temp(Src) 98.7 F (37.1 C) (Oral)  Resp 18  Ht  (1.676 m)  Wt 217 lb (98.431 kg)  BMI 35.04 kg/m2  SpO2 97%  LMP 08/22/2014   Physical Exam  Constitutional: She is oriented to person, place, and time. She appears well-developed and  well-nourished. She appears distressed.  HENT:  Head: Normocephalic and atraumatic.  Eyes: Conjunctivae are normal. Pupils are equal, round, and reactive to light.  Neck: Normal range of motion. Neck supple.  Cardiovascular: Normal rate, regular rhythm and intact distal pulses.   Pulmonary/Chest: Effort normal and breath sounds normal. No respiratory distress.  Abdominal: Soft. Bowel sounds are normal. She exhibits no distension. There is tenderness (Worse on right side). There is no rebound.  Right CVA tenderness  Musculoskeletal: Normal range of motion.  Neurological: She is alert and oriented to person, place, and time.  Skin: Skin is warm and dry. She is not diaphoretic.    ED Course  Procedures   Labs Review Labs Reviewed  COMPREHENSIVE METABOLIC PANEL - Abnormal; Notable for the following:    Potassium 3.4 (*)    Calcium 8.3 (*)    Total Protein 6.2 (*)    Albumin 3.4 (*)    AST 13 (*)    ALT 11 (*)    All other components within normal limits  URINALYSIS, ROUTINE W REFLEX MICROSCOPIC (NOT AT [facility]) - Abnormal; Notable for the following:    Color, Urine RED (*)    APPearance TURBID (*)    Hgb urine dipstick LARGE (*)    Bilirubin Urine SMALL (*)    Ketones, ur 15 (*)    Protein, ur 30 (*)    Nitrite POSITIVE (*)    Leukocytes, UA MODERATE (*)    All other components within normal limits  URINE MICROSCOPIC-ADD ON - Abnormal; Notable for the following:    Squamous Epithelial / LPF FEW (*)    Bacteria, UA MANY (*)    All other components within normal limits  GRAM STAIN  LIPASE, BLOOD  CBC WITH DIFFERENTIAL/PLATELET  I-STAT BETA HCG BLOOD, ED ([facility], [facility], [facility] ONLY)  I-STAT CREATININE, ED  I-STAT CG4 LACTIC ACID, ED    Imaging Review Ct Abdomen Pelvis W Contrast  10/23/2014   CLINICAL DATA:  39 year old female with right lower quadrant abdominal pain and vaginal bleeding.  EXAM: CT ABDOMEN AND PELVIS WITH CONTRAST  TECHNIQUE: Multidetector CT imaging of the abdomen and  pelvis was performed using the standard protocol following bolus administration of intravenous contrast.  CONTRAST:  OMNIPAQUE IOHEXOL 300 MG/ML  SOLN  COMPARISON:  CT dated 11/04/2013  FINDINGS: Minimal bibasilar dependent atelectatic changes. The visualized lung bases are clear. No intra-abdominal free air or free fluid.  The liver, gallbladder, pancreas, spleen, adrenal glands, kidneys, visualized ureters, and urinary bladder appear unremarkable. The uterus is anteverted and grossly unremarkable.  There is apparent thickening of the gastric wall, likely related to underdistention. Gastritis is less likely. Clinical correlation is recommended. There is no evidence of bowel obstruction or inflammation. Normal appendix.  The abdominal aorta and IVC appear unremarkable. No portal venous gas identified. There is no lymphadenopathy.  Small fat containing umbilical hernia. The osseous structures appear unremarkable.  IMPRESSION: Underdistention of the stomach versus less likely gastritis. Clinical correlation is recommended. No evidence of bowel obstruction. Normal appendix.   Electronically Signed   By: Elgie Collard M.D.   On: 10/23/2014 19:07   I have personally reviewed and evaluated these images and lab results as part of my medical decision-making.   EKG Interpretation None      MDM  Ms. Karas is a 39 yo female w/ PMHx of bipolar disease, depression, and thyroid disorder presenting with abdominal pain. Nausea onset 3 days ago associated with decreased appetite. Generalized abdominal pain onset today worse in lower quadrants. Denies emesis, diarrhea, constipation, melena, hematochezia, hematuria, dysuria, urinary frequency, urinary urgency, or vaginal discharge. LMP July 25th. Reports initial positive pressure test followed by a negative pregnancy test at home. Began with vaginal bleeding this morning.  Exam above notable for young female lying in stretcher in moderate distress secondary to  pain. Afebrile. Heart rate 70s and 80s. Normotensive. Not tachypneic. Breathing well on room air. Right CVA tenderness. Tenderness throughout worse on right and suprapubic region. No rebound.  WBC 7.7. Hemoglobin 13.4. CMP relatively unremarkable. I-STAT beta hCG negative. UA showing moderate leukocytes, positive nitrite, and Gram stain positive for gram-negative rods. CT abdomen and pelvis showing no evidence of bowel obstruction, appendicitis, probably nephritis, or any other acute intra-abdominal disease.  Given nausea, vomiting, abdominal pain in the setting of UTI with right CVA tenderness - patient may have early pyelonephritis even though no current evidence on CT scan. Patient given dose of IV Rocephin in the ED and prescribed an antibiotic regimen to cover pyelonephritis outpatient.  Patient discharged home in stable condition. Strict ED return precautions discussed. Patient understands and agrees with plan has no further questions or concerns this time.  Patient care discussed with and followed by my attending, Crista Curb   Final diagnoses:  Urinary tract infection without hematuria, site unspecified    Angelina Ok, MD 10/23/14 1610  Lavera Guise, MD 10/24/14 [id number]

## 2014-10-23 NOTE — ED Notes (Signed)
[name], [facility] approved cab voucher for pt to be transported from [employer] [facility] [employer] to [facility] on [address]

## 2014-10-23 NOTE — ED Notes (Signed)
abd pain since this am.  lmp July  She also started having vaginal bleeding.  Nausea no vomiting.  Pt arrived by [facility]  Coming from [facility].  Iv per ems.  Hyperventilating at present.  Ems gave fentanyl 100 mcg which the pt reports does not help

## 2014-10-23 NOTE — ED Notes (Signed)
The pt reports tghat her pain is still there the shot has jujst taken the edge off.  Not drinking the oral contrast

## 2016-05-26 ENCOUNTER — Emergency Department (HOSPITAL_BASED_OUTPATIENT_CLINIC_OR_DEPARTMENT_OTHER)
Admission: EM | Admit: 2016-05-26 | Discharge: 2016-05-26 | Disposition: A | Discharge status: Home / Self Care | Payer: Self-pay | Attending: Emergency Medicine | Admitting: Emergency Medicine

## 2016-05-26 ENCOUNTER — Emergency Department (HOSPITAL_BASED_OUTPATIENT_CLINIC_OR_DEPARTMENT_OTHER): Payer: Self-pay

## 2016-05-26 ENCOUNTER — Encounter (HOSPITAL_BASED_OUTPATIENT_CLINIC_OR_DEPARTMENT_OTHER): Payer: Self-pay | Admitting: *Deleted

## 2016-05-26 DIAGNOSIS — Z87891 Personal history of nicotine dependence: Secondary | ICD-10-CM | POA: Insufficient documentation

## 2016-05-26 DIAGNOSIS — E039 Hypothyroidism, unspecified: Secondary | ICD-10-CM | POA: Insufficient documentation

## 2016-05-26 DIAGNOSIS — R1030 Lower abdominal pain, unspecified: Secondary | ICD-10-CM

## 2016-05-26 DIAGNOSIS — N939 Abnormal uterine and vaginal bleeding, unspecified: Secondary | ICD-10-CM | POA: Insufficient documentation

## 2016-05-26 LAB — CBC
HEMATOCRIT: 37.7 % (ref 36.0–46.0)
HEMOGLOBIN: 12.8 g/dL (ref 12.0–15.0)
MCH: 30 pg (ref 26.0–34.0)
MCHC: 34 g/dL (ref 30.0–36.0)
MCV: 88.5 fL (ref 78.0–100.0)
PLATELETS: 258 10*3/uL (ref 150–400)
RBC: 4.26 MIL/uL (ref 3.87–5.11)
RDW: 12.8 % (ref 11.5–15.5)
WBC: 7 10*3/uL (ref 4.0–10.5)

## 2016-05-26 LAB — COMPREHENSIVE METABOLIC PANEL
ALT: 13 U/L — ABNORMAL LOW (ref 14–54)
ANION GAP: 9 (ref 5–15)
AST: 14 U/L — ABNORMAL LOW (ref 15–41)
Albumin: 3.7 g/dL (ref 3.5–5.0)
Alkaline Phosphatase: 42 U/L (ref 38–126)
BUN: 12 mg/dL (ref 6–20)
CHLORIDE: 107 mmol/L (ref 101–111)
CO2: 22 mmol/L (ref 22–32)
CREATININE: 0.83 mg/dL (ref 0.44–1.00)
Calcium: 9.1 mg/dL (ref 8.9–10.3)
Glucose, Bld: 87 mg/dL (ref 65–99)
POTASSIUM: 3.7 mmol/L (ref 3.5–5.1)
SODIUM: 138 mmol/L (ref 135–145)
Total Bilirubin: 0.4 mg/dL (ref 0.3–1.2)
Total Protein: 6.9 g/dL (ref 6.5–8.1)

## 2016-05-26 LAB — PREGNANCY, URINE: PREG TEST UR: NEGATIVE

## 2016-05-26 LAB — URINALYSIS, ROUTINE W REFLEX MICROSCOPIC
Bilirubin Urine: NEGATIVE
GLUCOSE, UA: NEGATIVE mg/dL
Ketones, ur: NEGATIVE mg/dL
Nitrite: NEGATIVE
PROTEIN: NEGATIVE mg/dL
SPECIFIC GRAVITY, URINE: 1.019 (ref 1.005–1.030)
pH: 7 (ref 5.0–8.0)

## 2016-05-26 LAB — URINALYSIS, MICROSCOPIC (REFLEX)

## 2016-05-26 LAB — LIPASE, BLOOD: LIPASE: 19 U/L (ref 11–51)

## 2016-05-26 MED ORDER — ONDANSETRON HCL 4 MG/2ML IJ SOLN
4.0000 mg | Freq: Once | INTRAMUSCULAR | Status: AC
  Administered 2016-05-26: 4 mg via INTRAVENOUS
  Filled 2016-05-26: qty 2

## 2016-05-26 MED ORDER — KETOROLAC TROMETHAMINE 30 MG/ML IJ SOLN
30.0000 mg | Freq: Once | INTRAMUSCULAR | Status: AC
  Administered 2016-05-26: 30 mg via INTRAVENOUS
  Filled 2016-05-26: qty 1

## 2016-05-26 MED ORDER — FENTANYL CITRATE (PF) 100 MCG/2ML IJ SOLN
100.0000 ug | Freq: Once | INTRAMUSCULAR | Status: AC
  Administered 2016-05-26: 100 ug via INTRAVENOUS
  Filled 2016-05-26: qty 2

## 2016-05-26 NOTE — ED Notes (Signed)
Pt offered Ginger Ale or Sprite but declined. States she is still nauseated and does not feel she can drink anything at this time.

## 2016-05-26 NOTE — ED Notes (Signed)
Alert, NAD, calm, interactive, resps e/u, speaking in clear complete sentences, no dyspnea noted, skin W&D, VSS, c/o worse pain, also sob and nausea, states, "meds did nothing", (denies: dizziness or visual changes).

## 2016-05-26 NOTE — ED Notes (Signed)
Alert, NAD, calm, interactive, resps e/u, speaking in clear complete sentences, no dyspnea noted, skin W&D, eating crackers and peanut butter, denies nausea, "pain decreased", "mild sob remains".

## 2016-05-26 NOTE — ED Notes (Signed)
Immediately after giving fentanyl 100 mcg IV, patient stated that the pain medicine did not do anything for her and she would need something else.

## 2016-05-26 NOTE — ED Notes (Signed)
Patient transported to Ultrasound 

## 2016-05-26 NOTE — ED Triage Notes (Signed)
Pt reports abdominal and back pain since this am. Also reports weight gain with generalized swelling x2wks. Denies sob/chest pain. Reports fever of 102 last night.

## 2016-05-26 NOTE — ED Notes (Signed)
Pt at CT

## 2016-05-26 NOTE — ED Notes (Signed)
Pt reports weakness, tired, no energy for a couple days, back pain, lower abdomin pain around to the back.

## 2016-05-26 NOTE — ED Notes (Signed)
Not in room, pt in US.  

## 2016-05-26 NOTE — ED Provider Notes (Signed)
[facility] Provider Note   CSN: [id number] Arrival date & time: 05/26/16  1731   By signing my name below, I, Clarisse Gouge, attest that this documentation has been prepared under the direction and in the presence of Laurence Spates, MD. Electronically signed, Clarisse Gouge, ED Scribe. 05/26/16. 6:55 PM.  History   Chief Complaint Chief Complaint  Patient presents with  . Abdominal Pain  . Fever   The history is provided by the patient and medical records. No language interpreter was used.    [name] is a 41 y.o. female with h/o thyoroid disease, HTN, pyelonephritis, transported via family to the Emergency Department with concern for lower abdominal pain onset this morning. Associated generalized swelling, lower extremity pain worsened with standing, nausea, fatigue, hematuria, subjective fever last night, burning in the eyes, SOB, leg swelling, weight change, vaginal bleeding noted. She reports standing a lot at work. Describes 9/10 central and lower R abdominal pain radiating to the R flank area. Pt states she does not take medications normally and she has not taken any medications for her pain today. No other modifying factors noted. Pt seen for similar swelling on 05/23/2016, diagnosed with a UTI and prescribed Keflex at the time. Pt placed on lasix by PCP x > 1 month. No dysuria, diarrhea, vaginal discharge, chest pain, congestion, h/o kidney stone or any other complaints noted at this time.   Past Medical History:  Diagnosis Date  . ADD (attention deficit disorder)   . ADD (attention deficit disorder)   . Bipolar 1 disorder (HCC)   . Depression   . Hypothyroid   . Thyroid disease   . Urinary tract infection     Patient Active Problem List   Diagnosis Date Noted  . Pyelonephritis 11/04/2013  . UTI (lower urinary tract infection) 11/04/2013  . Hypotension 11/04/2013  . Sepsis (HCC) 11/04/2013    Past Surgical History:  Procedure Laterality Date  .  MOUTH SURGERY    . TUBAL LIGATION    . VAGINAL DELIVERY     X 6    OB History    Gravida Para Term Preterm AB Living   SAB TAB Ectopic Multiple Live Births   1               Home Medications    Prior to Admission medications   Medication Sig Start Date End Date Taking? Authorizing Provider  furosemide (LASIX) 20 MG tablet Take 20 mg by mouth.   Yes Historical Provider, MD  Phendimetrazine Tartrate 35 MG TABS Take by mouth.   Yes Historical Provider, MD  amphetamine-dextroamphetamine (ADDERALL) 20 MG tablet Take 10 mg by mouth daily.     Historical Provider, MD  ciprofloxacin (CIPRO) 500 MG tablet Take 1 tablet (500 mg total) by mouth 2 (two) times daily. 11/06/13   Penny Pia, MD  HYDROmorphone (DILAUDID) 2 MG tablet Take 1 tablet (2 mg total) by mouth every 4 (four) hours as needed for moderate pain or severe pain. 11/09/13   Maris Berger, MD  levothyroxine (SYNTHROID, LEVOTHROID) 50 MCG tablet Take 50 mcg by mouth every morning.     Historical Provider, MD  ondansetron (ZOFRAN ODT) 4 MG disintegrating tablet Take 1 tablet (4 mg total) by mouth every 8 (eight) hours as needed for nausea or vomiting. 10/23/14   Angelina Ok, MD  phenazopyridine (PYRIDIUM) 200 MG tablet Take 1 tablet (200 mg total) by mouth 3 (three) times  daily. 11/08/13   Maris Berger, MD    Family History Family History  Problem Relation Age of Onset  . Cancer Other   . Heart failure Other     Social History Social History  Substance Use Topics  . Smoking status: Former Smoker    Packs/day: 0.25    Types: Cigarettes    Quit date: 10/13/2013  . Smokeless tobacco: Never Used  . Alcohol use Yes     Comment: socially     Allergies   Latex; Percocet [oxycodone-acetaminophen]; Phenergan [promethazine hcl]; and Tramadol   Review of Systems Review of Systems  Constitutional: Positive for fatigue.  Cardiovascular: Positive for leg swelling.  Gastrointestinal: Positive for abdominal pain  and nausea.  Genitourinary: Positive for flank pain, hematuria and vaginal bleeding. Negative for dysuria and vaginal discharge.  All other systems reviewed and are negative.    Physical Exam Updated Vital Signs BP (!) 113/95 (BP Location: Left Arm)   Pulse 91   Temp 98.1 F (36.7 C) (Oral)   Resp (!) 22   Ht  (1.676 m)   Wt 245 lb (111.1 kg)   LMP 05/12/2016 (Approximate)   SpO2 100%   BMI 39.54 kg/m   Physical Exam  Constitutional: She is oriented to person, place, and time. She appears well-developed and well-nourished. No distress.  uncomfortable  HENT:  Head: Normocephalic and atraumatic.  Moist mucous membranes  Eyes: Conjunctivae are normal. Pupils are equal, round, and reactive to light.  Neck: Neck supple.  Cardiovascular: Normal rate, regular rhythm and normal heart sounds.   No murmur heard. Pulmonary/Chest: Effort normal and breath sounds normal.  Abdominal: Soft. Bowel sounds are normal. She exhibits no distension. There is tenderness (RLQ, suprapubic abd).  Genitourinary:  Genitourinary Comments: Moderate blood in vaginal vault, no cervical motion tenderness, +R adnexal tenderness  Musculoskeletal: She exhibits no edema.  Neurological: She is alert and oriented to person, place, and time.  Fluent speech  Skin: Skin is warm and dry.  Psychiatric: She has a normal mood and affect. Judgment normal.  Nursing note and vitals reviewed.    ED Treatments / Results  DIAGNOSTIC STUDIES: Oxygen Saturation is 100% on RA, NL by my interpretation.    COORDINATION OF CARE: 6:54 PM-Discussed next steps with pt. Pt verbalized understanding and is agreeable with the plan. Will order labs and medications.   Labs (all labs ordered are listed, but only abnormal results are displayed) Labs Reviewed  COMPREHENSIVE METABOLIC PANEL - Abnormal; Notable for the following:       Result Value   AST 14 (*)    ALT 13 (*)    All other components within normal limits    URINALYSIS, ROUTINE W REFLEX MICROSCOPIC - Abnormal; Notable for the following:    Hgb urine dipstick LARGE (*)    Leukocytes, UA SMALL (*)    All other components within normal limits  URINALYSIS, MICROSCOPIC (REFLEX) - Abnormal; Notable for the following:    Bacteria, UA RARE (*)    Squamous Epithelial / LPF 0-5 (*)    All other components within normal limits  LIPASE, BLOOD  CBC  PREGNANCY, URINE  GC/CHLAMYDIA PROBE AMP ([place]) NOT AT [facility]    EKG  EKG Interpretation None       Radiology US Transvaginal Non-ob  Result Date: 05/26/2016 CLINICAL DATA:  Weight gain with irregular vaginal bleeding, diffuse pelvic pain for 4 days EXAM: TRANSABDOMINAL AND TRANSVAGINAL ULTRASOUND OF PELVIS DOPPLER ULTRASOUND OF OVARIES TECHNIQUE: Both  transabdominal and transvaginal ultrasound examinations of the pelvis were performed. Transabdominal technique was performed for global imaging of the pelvis including uterus, ovaries, adnexal regions, and pelvic cul-de-sac. It was necessary to proceed with endovaginal exam following the transabdominal exam to visualize the endometrium and ovaries. Color and duplex Doppler ultrasound was utilized to evaluate blood flow to the ovaries. COMPARISON:  CT 05/26/2016 FINDINGS: Uterus Measurements: 10 x 4.4 x 5.8 cm. Heterogenous echotexture but no discrete mass. Endometrium Thickness: 11.3 mm.  Slightly ill-defined. Right ovary Measurements: 3.2 x 2 x 2.1 cm. Normal appearance/no adnexal mass. Left ovary Measurements: 2.1 x 1.4 x 1.7 cm. Normal appearance/no adnexal mass. Pulsed Doppler evaluation of both ovaries demonstrates normal low-resistance arterial and venous waveforms. Other findings No abnormal free fluid. IMPRESSION: 1. Technically difficult study per sonographer. 2. No definite evidence for ovarian torsion 3. Poorly defined/difficult to visualize endometrial stripe however endometrial thickness is within normal limits. Electronically Signed   By: Jasmine Pang M.D.   On: 05/26/2016 22:04   US Pelvis Complete  Result Date: 05/26/2016 CLINICAL DATA:  Weight gain with irregular vaginal bleeding, diffuse pelvic pain for 4 days EXAM: TRANSABDOMINAL AND TRANSVAGINAL ULTRASOUND OF PELVIS DOPPLER ULTRASOUND OF OVARIES TECHNIQUE: Both transabdominal and transvaginal ultrasound examinations of the pelvis were performed. Transabdominal technique was performed for global imaging of the pelvis including uterus, ovaries, adnexal regions, and pelvic cul-de-sac. It was necessary to proceed with endovaginal exam following the transabdominal exam to visualize the endometrium and ovaries. Color and duplex Doppler ultrasound was utilized to evaluate blood flow to the ovaries. COMPARISON:  CT 05/26/2016 FINDINGS: Uterus Measurements: 10 x 4.4 x 5.8 cm. Heterogenous echotexture but no discrete mass. Endometrium Thickness: 11.3 mm.  Slightly ill-defined. Right ovary Measurements: 3.2 x 2 x 2.1 cm. Normal appearance/no adnexal mass. Left ovary Measurements: 2.1 x 1.4 x 1.7 cm. Normal appearance/no adnexal mass. Pulsed Doppler evaluation of both ovaries demonstrates normal low-resistance arterial and venous waveforms. Other findings No abnormal free fluid. IMPRESSION: 1. Technically difficult study per sonographer. 2. No definite evidence for ovarian torsion 3. Poorly defined/difficult to visualize endometrial stripe however endometrial thickness is within normal limits. Electronically Signed   By: Jasmine Pang M.D.   On: 05/26/2016 22:04   [place] Art/ven Flow Abd Pelv Doppler  Result Date: 05/26/2016 CLINICAL DATA:  Weight gain with irregular vaginal bleeding, diffuse pelvic pain for 4 days EXAM: TRANSABDOMINAL AND TRANSVAGINAL ULTRASOUND OF PELVIS DOPPLER ULTRASOUND OF OVARIES TECHNIQUE: Both transabdominal and transvaginal ultrasound examinations of the pelvis were performed. Transabdominal technique was performed for global imaging of the pelvis including uterus, ovaries,  adnexal regions, and pelvic cul-de-sac. It was necessary to proceed with endovaginal exam following the transabdominal exam to visualize the endometrium and ovaries. Color and duplex Doppler ultrasound was utilized to evaluate blood flow to the ovaries. COMPARISON:  CT 05/26/2016 FINDINGS: Uterus Measurements: 10 x 4.4 x 5.8 cm. Heterogenous echotexture but no discrete mass. Endometrium Thickness: 11.3 mm.  Slightly ill-defined. Right ovary Measurements: 3.2 x 2 x 2.1 cm. Normal appearance/no adnexal mass. Left ovary Measurements: 2.1 x 1.4 x 1.7 cm. Normal appearance/no adnexal mass. Pulsed Doppler evaluation of both ovaries demonstrates normal low-resistance arterial and venous waveforms. Other findings No abnormal free fluid. IMPRESSION: 1. Technically difficult study per sonographer. 2. No definite evidence for ovarian torsion 3. Poorly defined/difficult to visualize endometrial stripe however endometrial thickness is within normal limits. Electronically Signed   By: Jasmine Pang M.D.   On: 05/26/2016 22:04  Ct Renal Stone Study  Result Date: 05/26/2016 CLINICAL DATA:  Abdomen and back pain since this morning. Weight gain with diffuse swelling for the past 2 weeks. EXAM: CT ABDOMEN AND PELVIS WITHOUT CONTRAST TECHNIQUE: Multidetector CT imaging of the abdomen and pelvis was performed following the standard protocol without IV contrast. COMPARISON:  10/08/2015. FINDINGS: Lower chest: Minimal right lower lobe dependent atelectasis, improved. Hepatobiliary: No focal liver abnormality is seen. No gallstones, gallbladder wall thickening, or biliary dilatation. Pancreas: Unremarkable. No pancreatic ductal dilatation or surrounding inflammatory changes. Spleen: Normal in size without focal abnormality. Adrenals/Urinary Tract: Adrenal glands are unremarkable. Kidneys are normal, without renal calculi, focal lesion, or hydronephrosis. Bladder is unremarkable. Stomach/Bowel: Stomach is within normal limits. Appendix  appears normal. No evidence of bowel wall thickening, distention, or inflammatory changes. Vascular/Lymphatic: No significant vascular findings are present. No enlarged abdominal or pelvic lymph nodes. Reproductive: Bilateral tubal ligation rings. Unremarkable uterus and ovaries. Other: Very small umbilical hernia containing fat. No free peritoneal fluid or subcutaneous edema. Musculoskeletal: Mild lumbar and lower thoracic spine degenerative changes. IMPRESSION: No acute abnormality. Electronically Signed   By: Beckie Salts M.D.   On: 05/26/2016 19:08    Procedures Procedures (including critical care time)  Medications Ordered in ED Medications  fentaNYL (SUBLIMAZE) injection 100 mcg (100 mcg Intravenous Given 05/26/16 1914)  ondansetron (ZOFRAN) injection 4 mg (4 mg Intravenous Given 05/26/16 1913)  ondansetron (ZOFRAN) injection 4 mg (4 mg Intravenous Given 05/26/16 2042)  ketorolac (TORADOL) 30 MG/ML injection 30 mg (30 mg Intravenous Given 05/26/16 2042)     Initial Impression / Assessment and Plan / ED Course  I have reviewed the triage vital signs and the nursing notes.  Pertinent labs & imaging results that were available during my care of the patient were reviewed by me and considered in my medical decision making (see chart for details).     Pt w/ R sided abd pain and back pain today in setting of Ongoing problems with weight gain, swelling for which she has been evaluated by PCP. She was uncomfortable on exam, VS stable, RLQ and suprapubic tenderness noted. Labs show UA containing blood but no signs of infection, normal CBC and CMP. I discussed risks and benefits of obtaining CT to evaluate for acute process such as renal stone; risks including radiation exposure. Patient voiced understanding but wanted to proceed with imaging. CT was negative for acute process to explain her symptoms. She later stated that she was having vaginal bleeding which was abnormal for her as she is normally  very regular on her menstrual cycle. Performed a pelvic exam which showed a right adnexal tenderness but no cervical motion tenderness or findings to suggest PID. Obtained ultrasound to evaluate for ovarian pathology. Ultrasound was technically limited but did not show any obvious findings to explain her symptoms. Given her abnormal vaginal bleeding and pain, I suspect her symptoms could be gynecologic and I have recommended follow-up with OB/GYN for further investigation of her symptoms. On repeat exam after receiving Toradol, the patient was smiling and stated that she felt much better. Reviewed return precautions and patient discharged in satisfactory condition.  Final Clinical Impressions(s) / ED Diagnoses   Final diagnoses:  Lower abdominal pain  Abnormal vaginal bleeding    New Prescriptions Discharge Medication List as of 05/26/2016 10:26 PM    I personally performed the services described in this documentation, which was scribed in my presence. The recorded information has been reviewed and is accurate.  Laurence Spates, MD 05/27/16 [phone]

## 2016-05-28 LAB — GC/CHLAMYDIA PROBE AMP (~~LOC~~) NOT AT ARMC
CHLAMYDIA, DNA PROBE: NEGATIVE
NEISSERIA GONORRHEA: NEGATIVE

## 2016-06-17 ENCOUNTER — Encounter (HOSPITAL_COMMUNITY): Payer: Self-pay | Admitting: Emergency Medicine

## 2016-06-17 ENCOUNTER — Emergency Department (HOSPITAL_COMMUNITY): Payer: Self-pay

## 2016-06-17 ENCOUNTER — Emergency Department (HOSPITAL_COMMUNITY)
Admission: EM | Admit: 2016-06-17 | Discharge: 2016-06-17 | Disposition: A | Discharge status: Home / Self Care | Payer: Self-pay | Attending: Emergency Medicine | Admitting: Emergency Medicine

## 2016-06-17 DIAGNOSIS — R0602 Shortness of breath: Secondary | ICD-10-CM | POA: Insufficient documentation

## 2016-06-17 DIAGNOSIS — E039 Hypothyroidism, unspecified: Secondary | ICD-10-CM | POA: Insufficient documentation

## 2016-06-17 DIAGNOSIS — Z91040 Latex allergy status: Secondary | ICD-10-CM | POA: Insufficient documentation

## 2016-06-17 DIAGNOSIS — Z87891 Personal history of nicotine dependence: Secondary | ICD-10-CM | POA: Insufficient documentation

## 2016-06-17 DIAGNOSIS — F909 Attention-deficit hyperactivity disorder, unspecified type: Secondary | ICD-10-CM | POA: Insufficient documentation

## 2016-06-17 DIAGNOSIS — M7989 Other specified soft tissue disorders: Secondary | ICD-10-CM | POA: Insufficient documentation

## 2016-06-17 LAB — CBC
HEMATOCRIT: 40.6 % (ref 36.0–46.0)
HEMOGLOBIN: 13.7 g/dL (ref 12.0–15.0)
MCH: 30 pg (ref 26.0–34.0)
MCHC: 33.7 g/dL (ref 30.0–36.0)
MCV: 89 fL (ref 78.0–100.0)
Platelets: 244 10*3/uL (ref 150–400)
RBC: 4.56 MIL/uL (ref 3.87–5.11)
RDW: 13.3 % (ref 11.5–15.5)
WBC: 6.3 10*3/uL (ref 4.0–10.5)

## 2016-06-17 LAB — BASIC METABOLIC PANEL
ANION GAP: 7 (ref 5–15)
BUN: 13 mg/dL (ref 6–20)
CALCIUM: 8.6 mg/dL — AB (ref 8.9–10.3)
CHLORIDE: 111 mmol/L (ref 101–111)
CO2: 22 mmol/L (ref 22–32)
Creatinine, Ser: 0.63 mg/dL (ref 0.44–1.00)
GFR calc non Af Amer: >60 mL/min (ref >60)
GLUCOSE: 95 mg/dL (ref 65–99)
POTASSIUM: 3.9 mmol/L (ref 3.5–5.1)
Sodium: 140 mmol/L (ref 135–145)

## 2016-06-17 LAB — HEPATIC FUNCTION PANEL
ALBUMIN: 3.8 g/dL (ref 3.5–5.0)
ALK PHOS: 43 U/L (ref 38–126)
ALT: 17 U/L (ref 14–54)
AST: 19 U/L (ref 15–41)
Bilirubin, Direct: <0.1 mg/dL — ABNORMAL LOW (ref 0.1–0.5)
TOTAL PROTEIN: 7 g/dL (ref 6.5–8.1)
Total Bilirubin: 0.4 mg/dL (ref 0.3–1.2)

## 2016-06-17 LAB — I-STAT TROPONIN, ED: Troponin i, poc: 0 ng/mL (ref 0.00–0.08)

## 2016-06-17 LAB — BRAIN NATRIURETIC PEPTIDE: B Natriuretic Peptide: 28 pg/mL (ref 0.0–100.0)

## 2016-06-17 LAB — D-DIMER, QUANTITATIVE (NOT AT ARMC): D DIMER QUANT: 0.63 ug{FEU}/mL — AB (ref 0.00–0.50)

## 2016-06-17 MED ORDER — ENOXAPARIN SODIUM 120 MG/0.8ML ~~LOC~~ SOLN
1.0000 mg/kg | Freq: Once | SUBCUTANEOUS | Status: AC
  Administered 2016-06-17: 115 mg via SUBCUTANEOUS
  Filled 2016-06-17: qty 0.76

## 2016-06-17 NOTE — ED Provider Notes (Signed)
[facility]-EMERGENCY DEPT Provider Note   CSN: [id number] Arrival date & time: 06/17/16  1430     History   Chief Complaint Chief Complaint  Patient presents with  . Chest Pain    HPI [name] is a 41 y.o. female.  HPI  Patient presents with chest pain and shortness of breath and 3 week history of bilateral leg swelling that she states has worsened yesterday and today. She states it is painful to walk due to the swelling in her legs. She has experienced leg swelling before but she states that she attributed this to weight gain in the past. This is the first time she is seen anyone about the leg swelling. She states that she has shortness of breath even walking small distances. She denies any history of blood clots or heart failure. States her last echo was done several years ago. Also describes occasional "skipping heartbeat." Denies hemoptysis, OCP use, diarrhea, vomiting.  Past Medical History:  Diagnosis Date  . ADD (attention deficit disorder)   . ADD (attention deficit disorder)   . Bipolar 1 disorder (HCC)   . Depression   . Hypothyroid   . Thyroid disease   . Urinary tract infection     Patient Active Problem List   Diagnosis Date Noted  . Pyelonephritis 11/04/2013  . UTI (lower urinary tract infection) 11/04/2013  . Hypotension 11/04/2013  . Sepsis (HCC) 11/04/2013    Past Surgical History:  Procedure Laterality Date  . MOUTH SURGERY    . TUBAL LIGATION    . VAGINAL DELIVERY     X 6    OB History    Gravida Para Term Preterm AB Living   7 6 6   1 6    SAB TAB Ectopic Multiple Live Births   1               Home Medications    Prior to Admission medications   Not on File    Family History Family History  Problem Relation Age of Onset  . Cancer Other   . Heart failure Other     Social History Social History  Substance Use Topics  . Smoking status: Former Smoker    Packs/day: 0.25    Types: Cigarettes    Quit date: 10/13/2013  . Smokeless  tobacco: Never Used  . Alcohol use Yes     Comment: socially     Allergies   Latex; Percocet [oxycodone-acetaminophen]; Phenergan [promethazine hcl]; and Tramadol   Review of Systems Review of Systems  Constitutional: Negative for appetite change, chills and fever.  HENT: Negative for ear pain, rhinorrhea, sneezing and sore throat.   Eyes: Negative for photophobia and visual disturbance.  Respiratory: Positive for shortness of breath. Negative for cough, chest tightness and wheezing.   Cardiovascular: Positive for chest pain and leg swelling. Negative for palpitations.  Gastrointestinal: Negative for abdominal pain, blood in stool, constipation, diarrhea, nausea and vomiting.  Genitourinary: Negative for dysuria, hematuria and urgency.  Musculoskeletal: Positive for joint swelling. Negative for myalgias.  Skin: Negative for rash.  Neurological: Negative for dizziness, weakness and light-headedness.     Physical Exam Updated Vital Signs BP (!) 147/89 (BP Location: Right Arm)   Pulse 84   Temp 98.5 F (36.9 C) (Oral)   Resp 18   LMP 05/30/2016   SpO2 100%   Physical Exam  Constitutional: She appears well-developed and well-nourished. No distress.  HENT:  Head: Normocephalic and atraumatic.  Nose: Nose normal.  Eyes: Conjunctivae  and EOM are normal. Right eye exhibits no discharge. Left eye exhibits no discharge. No scleral icterus.  Neck: Normal range of motion. Neck supple.  Cardiovascular: Normal rate, regular rhythm, normal heart sounds and intact distal pulses.  Exam reveals no gallop and no friction rub.   No murmur heard. Pulmonary/Chest: Effort normal and breath sounds normal. No respiratory distress.  Abdominal: Soft. Bowel sounds are normal. She exhibits no distension. There is no tenderness. There is no guarding.  Musculoskeletal: Normal range of motion. She exhibits edema (Bilateral lower extremity edema. Nonpitting.) and tenderness.  No redness, temperature  change. Good distal pulses. Area appears neurovascularly intact.  Neurological: She is alert. She exhibits normal muscle tone. Coordination normal.  Skin: Skin is warm and dry. No rash noted.  Psychiatric: She has a normal mood and affect.  Nursing note and vitals reviewed.    ED Treatments / Results  Labs (all labs ordered are listed, but only abnormal results are displayed) Labs Reviewed  BASIC METABOLIC PANEL - Abnormal; Notable for the following:       Result Value   Calcium 8.6 (*)    All other components within normal limits  D-DIMER, QUANTITATIVE (NOT AT [facility]) - Abnormal; Notable for the following:    D-Dimer, Quant 0.63 (*)    All other components within normal limits  HEPATIC FUNCTION PANEL - Abnormal; Notable for the following:    Bilirubin, Direct <0.1 (*)    All other components within normal limits  CBC  BRAIN NATRIURETIC PEPTIDE  I-STAT TROPOININ, ED    EKG  EKG Interpretation None       Radiology Dg Chest 2 View  Result Date: 06/17/2016 CLINICAL DATA:  Pt c/o chest pain and swelling to bilateral legs. Denies any previous cardiac or lung issues prior to this episode. Former smoker, quit x 3 months ago. Nondiabetic. NonHTN. EXAM: CHEST  2 VIEW COMPARISON:  Chest x-ray dated 05/27/2013. FINDINGS: The heart size and mediastinal contours are within normal limits. Both lungs are clear. The visualized skeletal structures are unremarkable. IMPRESSION: No active cardiopulmonary disease. No evidence of pneumonia or pulmonary edema. Electronically Signed   By: Bary Richard M.D.   On: 06/17/2016 15:10    Procedures Procedures (including critical care time)  Medications Ordered in ED Medications  enoxaparin (LOVENOX) injection 1 mg/kg (not administered)     Initial Impression / Assessment and Plan / ED Course  I have reviewed the triage vital signs and the nursing notes.  Pertinent labs & imaging results that were available during my care of the patient were  reviewed by me and considered in my medical decision making (see chart for details).     Patient's history and symptoms warrant concern for DVT versus PE versus pneumonia versus ACS versus heart failure. Troponin, CBC, CMP, BNP normal. Obtained a d-dimer which was elevated. Patient does not appear to have a PE considering she is satting 100%, no hemoptysis, no previous history of clots, not tachycardic, no history of blood disorder or malignancy. She has had this slight swelling in the past but states that it is painful to walk and she is concerned that is getting worse. No evidence for heart failure exacerbation at this time. Chest x-ray was negative for any acute cardiopulmonary process. EKG showed no acute changes. After discussing with the patient, advised her to follow up tomorrow for outpatient ultrasound of bilateral lower extremities. Although there is concern for DVT, due to elevated d-dimer today will give Lovenox here. Patient  also requests that she required to wear comfortable shoes at work. We'll give her compression stockings to help with swelling. Advised to follow-up with PCP for further evaluation if needed pending bilateral lower extremity ultrasounds tomorrow. Return precautions given.  Final Clinical Impressions(s) / ED Diagnoses   Final diagnoses:  Leg swelling    New Prescriptions New Prescriptions   No medications on file     Dietrich PatesKhatri, Macaila Tahir, Cordelia Poche-C 06/17/16 1944    Gerhard MunchLockwood, Robert, MD 06/18/16 [phone]

## 2016-06-17 NOTE — ED Notes (Signed)
PT DISCHARGED. INSTRUCTIONS AND PRESCRIPTION GIVEN. AAOX4. PT IN NO APPARENT DISTRESS WITH MILD PAIN. THE OPPORTUNITY TO ASK QUESTIONS WAS PROVIDED. 

## 2016-06-17 NOTE — ED Triage Notes (Signed)
Patient c/o sharp left chest pain radiating to the left neck with SOB since last night. Patient also c/o bilateral leg swelling x2 weeks. Ambulatory to triage. Denies N/V/D, dizziness, abdominal pain.

## 2016-06-17 NOTE — Discharge Instructions (Signed)
Obtain outpatient ultrasound of lower extremities. Wear compression stockings. Follow up with PCP for further evaluation. Return to ED for worsening pain, increased swelling, trouble breathing, coughing up blood,

## 2016-06-18 ENCOUNTER — Ambulatory Visit (HOSPITAL_COMMUNITY)
Admission: RE | Admit: 2016-06-18 | Discharge: 2016-06-18 | Disposition: A | Discharge status: Home / Self Care | Payer: Self-pay | Source: Ambulatory Visit | Attending: Physician Assistant | Admitting: Physician Assistant

## 2016-06-18 DIAGNOSIS — M79609 Pain in unspecified limb: Secondary | ICD-10-CM

## 2016-06-18 DIAGNOSIS — M7989 Other specified soft tissue disorders: Secondary | ICD-10-CM | POA: Insufficient documentation

## 2016-06-18 NOTE — Progress Notes (Signed)
VASCULAR LAB PRELIMINARY  PRELIMINARY  PRELIMINARY  PRELIMINARY  Bilateral lower extremity venous duplex completed.    Preliminary report:  Bilateral:  No evidence of DVT, superficial thrombosis, or Baker's Cyst.   Searra Carnathan, RVS 06/18/2016, 10:16 AM

## 2017-10-05 IMAGING — CR DG CHEST 2V
2 series · 2 of 2 positions shown · non-contrast
Comparison: Chest x-ray dated 05/27/2013.

CLINICAL DATA: Pt c/o chest pain and swelling to bilateral legs.
Denies any previous cardiac or lung issues prior to this episode.
Former smoker, quit x 3 months ago. Nondiabetic. NonHTN.

EXAM:
CHEST  2 VIEW

[w chest pa]
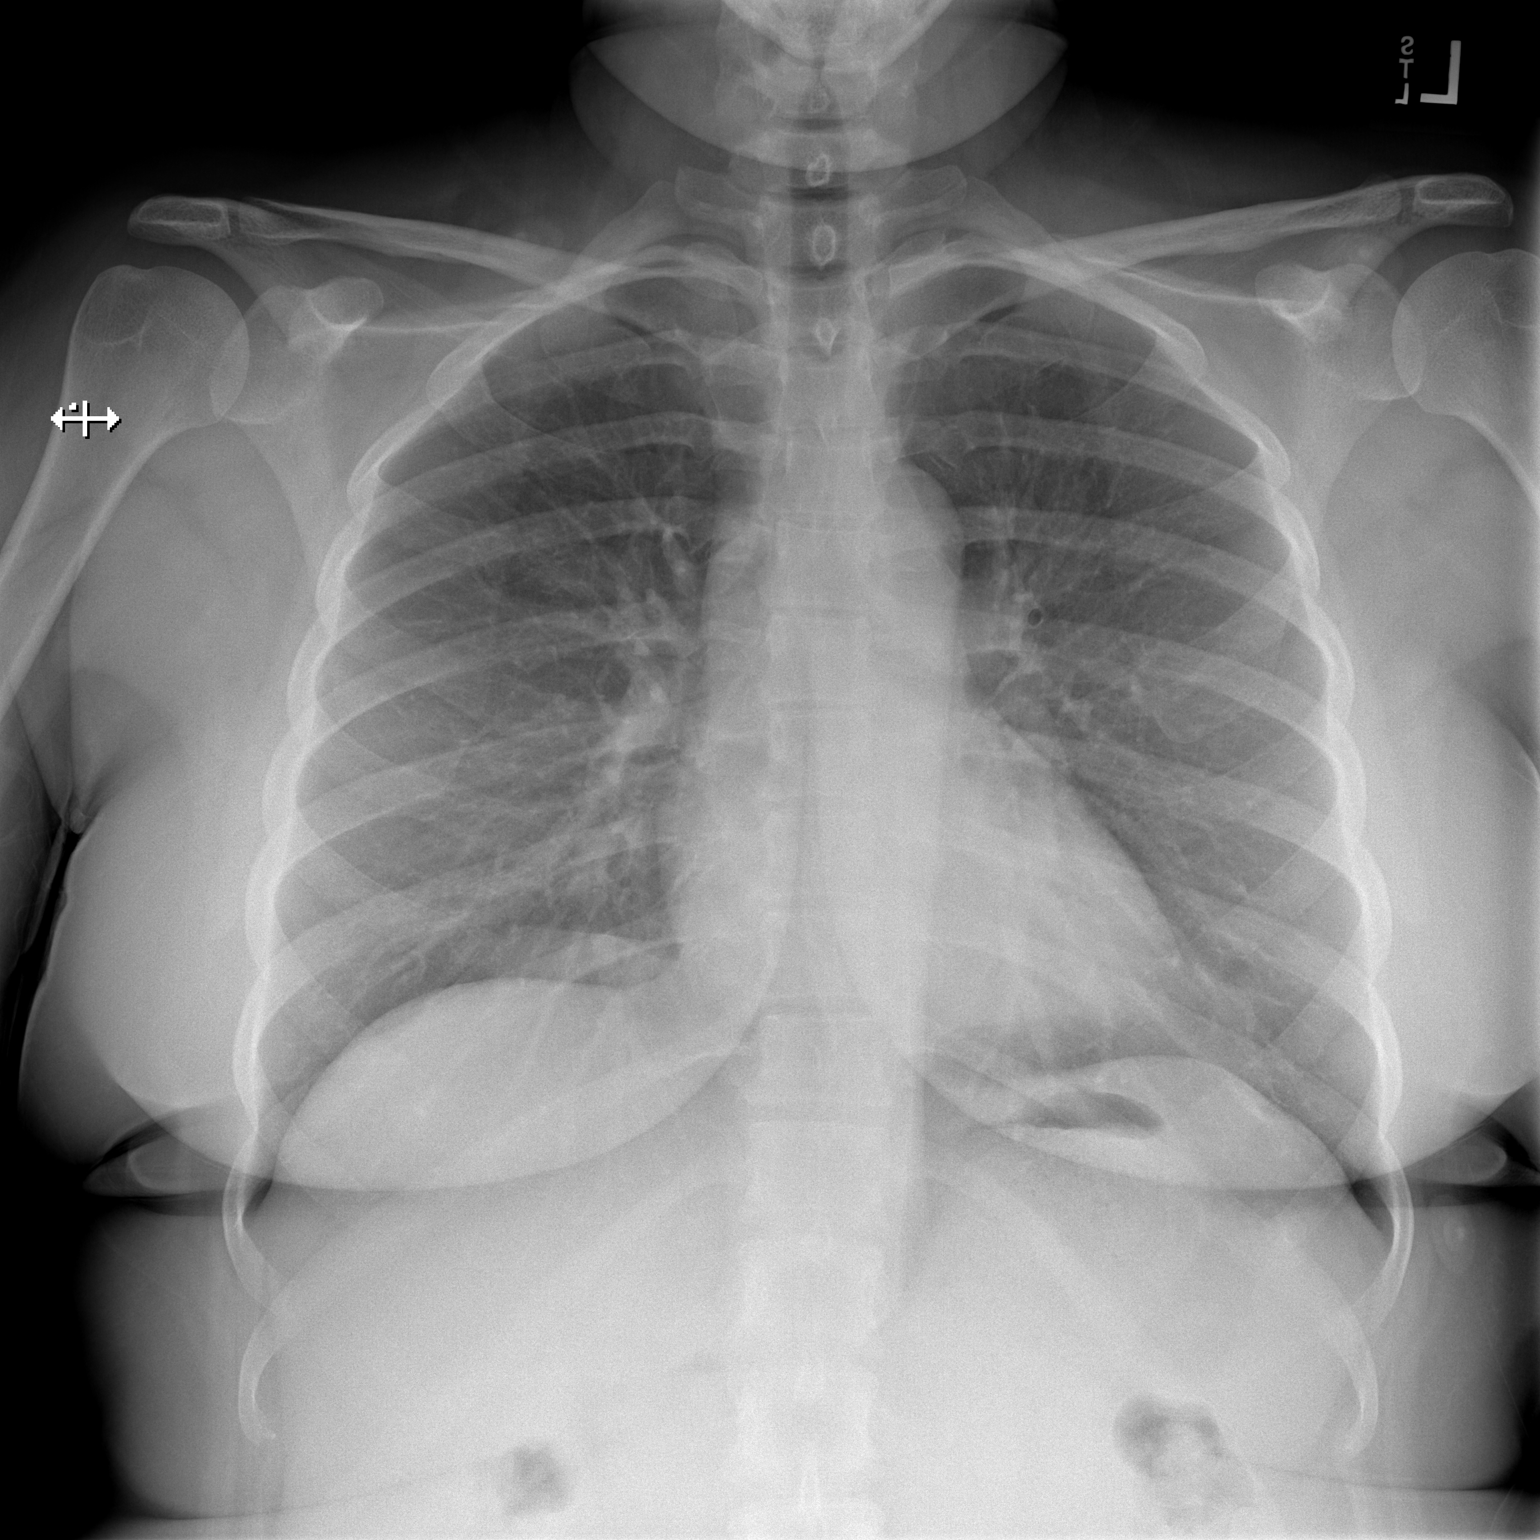

[w chest lat]
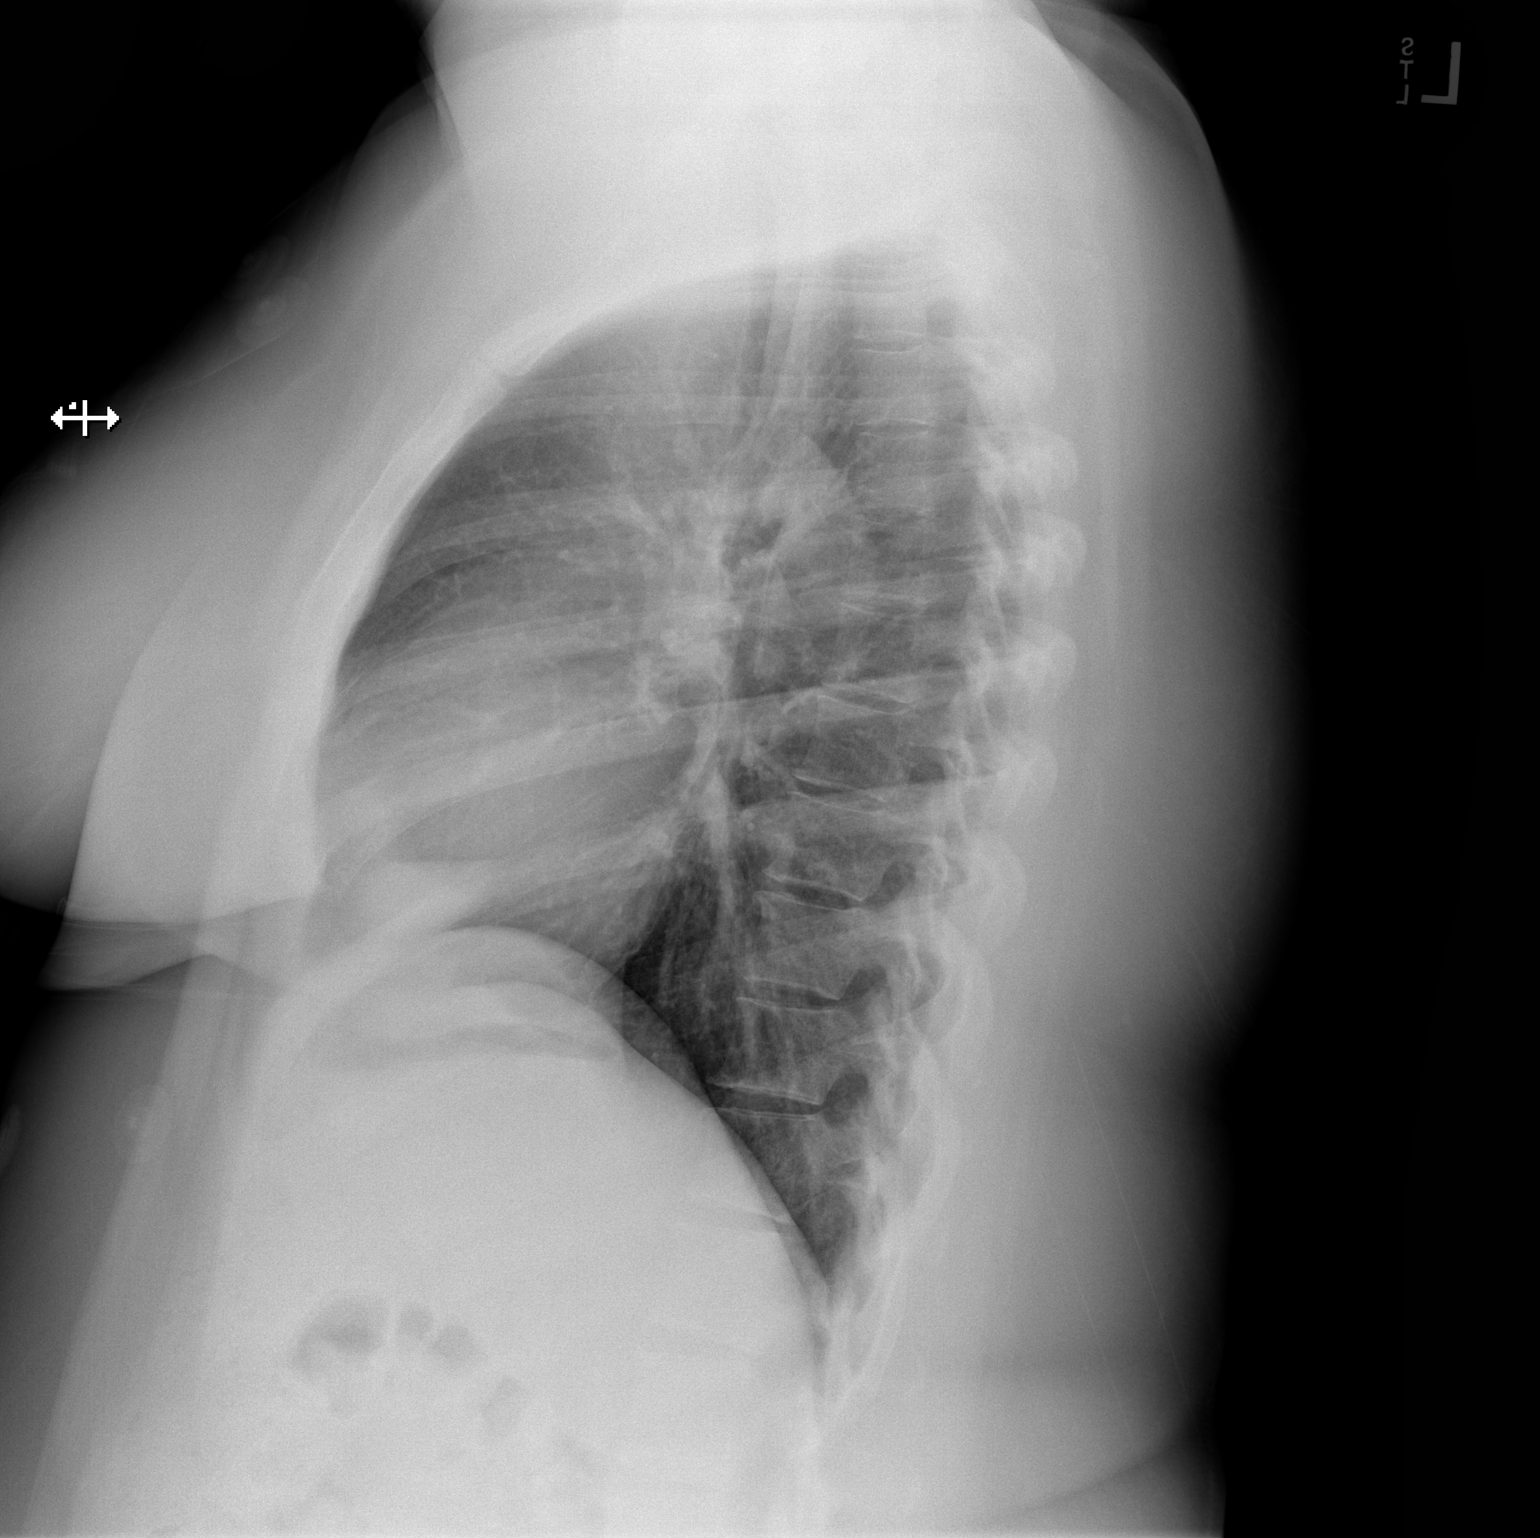

[2 of 2 positions shown; findings below may reference images not displayed]

FINDINGS: The heart size and mediastinal contours are within normal limits.
Both lungs are clear. The visualized skeletal structures are
unremarkable.
IMPRESSION: No active cardiopulmonary disease. No evidence of pneumonia or
pulmonary edema.

## 2017-12-25 ENCOUNTER — Ambulatory Visit: Payer: BLUE CROSS/BLUE SHIELD | Admitting: Psychology
